# Patient Record
Sex: Female | Born: 1945
Health system: Southern US, Community
[De-identification: ages and names within clinical notes are randomized; demographics above are authoritative.]

## PROBLEM LIST (undated history)

## (undated) DIAGNOSIS — E785 Hyperlipidemia, unspecified: Secondary | ICD-10-CM

## (undated) DIAGNOSIS — B019 Varicella without complication: Secondary | ICD-10-CM

## (undated) DIAGNOSIS — H409 Unspecified glaucoma: Secondary | ICD-10-CM

## (undated) DIAGNOSIS — M810 Age-related osteoporosis without current pathological fracture: Secondary | ICD-10-CM

## (undated) DIAGNOSIS — J45909 Unspecified asthma, uncomplicated: Secondary | ICD-10-CM

## (undated) DIAGNOSIS — H269 Unspecified cataract: Secondary | ICD-10-CM

## (undated) DIAGNOSIS — E079 Disorder of thyroid, unspecified: Secondary | ICD-10-CM

## (undated) DIAGNOSIS — I4891 Unspecified atrial fibrillation: Secondary | ICD-10-CM

## (undated) DIAGNOSIS — K8020 Calculus of gallbladder without cholecystitis without obstruction: Secondary | ICD-10-CM

## (undated) DIAGNOSIS — N390 Urinary tract infection, site not specified: Secondary | ICD-10-CM

## (undated) DIAGNOSIS — I10 Essential (primary) hypertension: Secondary | ICD-10-CM

## (undated) HISTORY — DX: Hyperlipidemia, unspecified: E78.5

## (undated) HISTORY — DX: Calculus of gallbladder without cholecystitis without obstruction: K80.20

## (undated) HISTORY — PX: ABDOMINAL HYSTERECTOMY: SHX81

## (undated) HISTORY — DX: Essential (primary) hypertension: I10

## (undated) HISTORY — PX: ESOPHAGOGASTRODUODENOSCOPY: SHX1529

## (undated) HISTORY — DX: Age-related osteoporosis without current pathological fracture: M81.0

## (undated) HISTORY — PX: BREAST SURGERY: SHX581

## (undated) HISTORY — DX: Varicella without complication: B01.9

## (undated) HISTORY — DX: Urinary tract infection, site not specified: N39.0

## (undated) HISTORY — DX: Disorder of thyroid, unspecified: E07.9

## (undated) HISTORY — DX: Unspecified cataract: H26.9

## (undated) HISTORY — DX: Unspecified atrial fibrillation: I48.91

## (undated) HISTORY — PX: MASTECTOMY: SHX3

## (undated) HISTORY — DX: Unspecified asthma, uncomplicated: J45.909

## (undated) HISTORY — DX: Unspecified glaucoma: H40.9

---

## 1978-08-21 HISTORY — PX: CHOLECYSTECTOMY: SHX55

## 1982-08-21 HISTORY — PX: APPENDECTOMY: SHX54

## 1989-08-21 HISTORY — PX: THYROIDECTOMY: SHX17

## 2009-08-21 HISTORY — PX: COLONOSCOPY: SHX174

## 2013-01-25 DIAGNOSIS — J45909 Unspecified asthma, uncomplicated: Secondary | ICD-10-CM | POA: Insufficient documentation

## 2014-09-24 DIAGNOSIS — I517 Cardiomegaly: Secondary | ICD-10-CM | POA: Insufficient documentation

## 2014-09-24 DIAGNOSIS — R7989 Other specified abnormal findings of blood chemistry: Secondary | ICD-10-CM | POA: Insufficient documentation

## 2015-03-11 DIAGNOSIS — I480 Paroxysmal atrial fibrillation: Secondary | ICD-10-CM | POA: Insufficient documentation

## 2015-03-11 DIAGNOSIS — I5022 Chronic systolic (congestive) heart failure: Secondary | ICD-10-CM | POA: Insufficient documentation

## 2015-03-11 DIAGNOSIS — I10 Essential (primary) hypertension: Secondary | ICD-10-CM | POA: Insufficient documentation

## 2015-08-22 HISTORY — PX: PARTIAL HIP ARTHROPLASTY: SHX733

## 2015-09-10 DIAGNOSIS — N200 Calculus of kidney: Secondary | ICD-10-CM | POA: Insufficient documentation

## 2015-10-29 DIAGNOSIS — S72401A Unspecified fracture of lower end of right femur, initial encounter for closed fracture: Secondary | ICD-10-CM | POA: Insufficient documentation

## 2015-11-24 DIAGNOSIS — M810 Age-related osteoporosis without current pathological fracture: Secondary | ICD-10-CM | POA: Insufficient documentation

## 2015-11-24 DIAGNOSIS — E559 Vitamin D deficiency, unspecified: Secondary | ICD-10-CM | POA: Insufficient documentation

## 2016-01-10 LAB — LIPID PANEL
CHOLESTEROL: 221 — AB (ref 0–200)
HDL: 64 (ref 35–70)
LDL CALC: 138
TRIGLYCERIDES: 95 (ref 40–160)

## 2016-01-10 LAB — BASIC METABOLIC PANEL
BUN: 30 — AB (ref 4–21)
CREATININE: 0.8 (ref 0.5–1.1)
Glucose: 104
Potassium: 3.6 (ref 3.4–5.3)
SODIUM: 143 (ref 137–147)

## 2016-01-10 LAB — HEPATIC FUNCTION PANEL
ALT: 9 (ref 7–35)
AST: 13 (ref 13–35)
Alkaline Phosphatase: 70 (ref 25–125)
BILIRUBIN, TOTAL: 0.5

## 2016-01-10 LAB — VITAMIN D 25 HYDROXY (VIT D DEFICIENCY, FRACTURES): Vit D, 25-Hydroxy: 30.3

## 2016-10-23 DIAGNOSIS — I447 Left bundle-branch block, unspecified: Secondary | ICD-10-CM | POA: Insufficient documentation

## 2017-03-23 LAB — CBC AND DIFFERENTIAL
HCT: 33 — AB (ref 36–46)
Hemoglobin: 10.9 — AB (ref 12.0–16.0)
Platelets: 273 (ref 150–399)

## 2017-03-23 LAB — BASIC METABOLIC PANEL
BUN: 22 — AB (ref 4–21)
Creatinine: 0.9 (ref 0.5–1.1)
Potassium: 4.1 (ref 3.4–5.3)
Sodium: 142 (ref 137–147)

## 2017-03-23 LAB — LIPID PANEL
Cholesterol: 211 — AB (ref 0–200)
HDL: 52 (ref 35–70)
Triglycerides: 92 (ref 40–160)

## 2017-03-28 DIAGNOSIS — D539 Nutritional anemia, unspecified: Secondary | ICD-10-CM | POA: Insufficient documentation

## 2017-03-28 DIAGNOSIS — I952 Hypotension due to drugs: Secondary | ICD-10-CM | POA: Insufficient documentation

## 2017-03-28 DIAGNOSIS — Z1211 Encounter for screening for malignant neoplasm of colon: Secondary | ICD-10-CM | POA: Insufficient documentation

## 2017-10-01 ENCOUNTER — Ambulatory Visit: Payer: Medicare Other | Admitting: Family Medicine

## 2017-10-01 ENCOUNTER — Encounter: Payer: Self-pay | Admitting: Family Medicine

## 2017-10-01 VITALS — BP 128/76 | HR 74 | Ht 65.0 in | Wt 153.4 lb

## 2017-10-01 DIAGNOSIS — M8000XS Age-related osteoporosis with current pathological fracture, unspecified site, sequela: Secondary | ICD-10-CM

## 2017-10-01 DIAGNOSIS — E559 Vitamin D deficiency, unspecified: Secondary | ICD-10-CM

## 2017-10-01 DIAGNOSIS — I10 Essential (primary) hypertension: Secondary | ICD-10-CM | POA: Insufficient documentation

## 2017-10-01 DIAGNOSIS — I480 Paroxysmal atrial fibrillation: Secondary | ICD-10-CM | POA: Diagnosis not present

## 2017-10-01 DIAGNOSIS — I519 Heart disease, unspecified: Secondary | ICD-10-CM | POA: Diagnosis not present

## 2017-10-01 DIAGNOSIS — M8000XA Age-related osteoporosis with current pathological fracture, unspecified site, initial encounter for fracture: Secondary | ICD-10-CM | POA: Insufficient documentation

## 2017-10-01 LAB — URINALYSIS, ROUTINE W REFLEX MICROSCOPIC
Bilirubin Urine: NEGATIVE
HGB URINE DIPSTICK: NEGATIVE
LEUKOCYTES UA: NEGATIVE
Nitrite: NEGATIVE
RBC / HPF: NONE SEEN (ref 0–?)
Specific Gravity, Urine: >=1.03 — AB (ref 1.000–1.030)
Total Protein, Urine: NEGATIVE
URINE GLUCOSE: NEGATIVE
Urobilinogen, UA: 0.2 (ref 0.0–1.0)
pH: 5.5 (ref 5.0–8.0)

## 2017-10-01 LAB — COMPREHENSIVE METABOLIC PANEL
ALBUMIN: 4 g/dL (ref 3.5–5.2)
ALK PHOS: 55 U/L (ref 39–117)
ALT: 10 U/L (ref 0–35)
AST: 12 U/L (ref 0–37)
BILIRUBIN TOTAL: 0.6 mg/dL (ref 0.2–1.2)
BUN: 27 mg/dL — AB (ref 6–23)
CO2: 27 mEq/L (ref 19–32)
CREATININE: 0.78 mg/dL (ref 0.40–1.20)
Calcium: 9.1 mg/dL (ref 8.4–10.5)
Chloride: 105 mEq/L (ref 96–112)
GFR: 77.48 mL/min (ref >60.00)
GLUCOSE: 88 mg/dL (ref 70–99)
POTASSIUM: 4 meq/L (ref 3.5–5.1)
SODIUM: 142 meq/L (ref 135–145)
TOTAL PROTEIN: 6.8 g/dL (ref 6.0–8.3)

## 2017-10-01 LAB — CBC
HCT: 34.6 % — ABNORMAL LOW (ref 35.0–45.0)
Hemoglobin: 11.8 g/dL (ref 11.7–15.5)
MCH: 30.3 pg (ref 27.0–33.0)
MCHC: 34.1 g/dL (ref 32.0–36.0)
MCV: 88.9 fL (ref 80.0–100.0)
MPV: 10.9 fL (ref 7.5–12.5)
PLATELETS: 287 10*3/uL (ref 140–400)
RBC: 3.89 10*6/uL (ref 3.80–5.10)
RDW: 12.8 % (ref 11.0–15.0)
WBC: 6.1 10*3/uL (ref 3.8–10.8)

## 2017-10-01 LAB — VITAMIN D 25 HYDROXY (VIT D DEFICIENCY, FRACTURES): VITD: 88.41 ng/mL (ref 30.00–100.00)

## 2017-10-01 MED ORDER — CARVEDILOL 12.5 MG PO TABS: 12.5000 mg | ORAL_TABLET | Freq: Two times a day (BID) | ORAL | 1 refills | Status: DC

## 2017-10-01 MED ORDER — ALENDRONATE SODIUM 70 MG PO TABS: 70.0000 mg | ORAL_TABLET | ORAL | 0 refills | Status: DC

## 2017-10-01 NOTE — Progress Notes (Signed)
Subjective:  Patient ID: Shelia Roth, female    DOB: 02/26/1946  Age: 72 y.o. MRN: [id number]  CC: Establish Care   HPI Shelia Roth presents for establishment of care and follow-up of her PAF, left ventricular dysfunction, hypertension, osteoporosis and vitamin D deficiency.  She has not had any high-dose vitamin D for over a month now.  She has been doing well.  She continues to work part-time.  She is helping to care for a sick friend in the lung cancer.  She is continuing to raise her great grandson.  Her grandson he has wife and children also live with her.  She was unable to go for the colonoscopy due to above-mentioned social responsibilities.  History Shelia Roth has a past medical history of Asthma, Chicken pox, Hyperlipidemia, and Thyroid disease.   She has a past surgical history that includes Breast surgery; Cholecystectomy; and Abdominal hysterectomy.   Her family history includes COPD in her father; Diabetes in her brother; Early death in her paternal grandfather; Heart attack in her maternal grandfather and paternal grandmother; Heart disease in her daughter and father; Hyperlipidemia in her brother; Hypertension in her father; Stroke in her maternal grandmother.She reports that  has never smoked. she has never used smokeless tobacco. She reports that she does not drink alcohol or use drugs.  Outpatient Medications Prior to Visit  Medication Sig Dispense Refill  . alendronate (FOSAMAX) 70 MG tablet Take 1 tablet by mouth once a week.    Marland Kitchen amLODipine (NORVASC) 5 MG tablet Take 1 tablet by mouth daily.    Marland Kitchen apixaban (ELIQUIS) 5 MG TABS tablet Take 5 mg by mouth daily.    . carvedilol (COREG) 12.5 MG tablet Take 1 tablet by mouth 2 (two) times daily.    Marland Kitchen losartan (COZAAR) 100 MG tablet Take 1 tablet by mouth daily.    . Vitamin D, Ergocalciferol, (DRISDOL) 50000 units CAPS capsule Take 50,000 Units by mouth every 7 (seven) days.     No facility-administered medications prior  to visit.     ROS Review of Systems  Constitutional: Negative.   HENT: Negative.   Eyes: Negative.   Respiratory: Negative.   Cardiovascular: Negative.   Gastrointestinal: Negative.   Endocrine: Negative for polyphagia and polyuria.  Genitourinary: Negative.   Musculoskeletal: Negative.   Skin: Negative.   Allergic/Immunologic: Negative for immunocompromised state.  Neurological: Negative.   Hematological: Negative.   Psychiatric/Behavioral: Negative.     Objective:  BP 128/76 (BP Location: Left Arm, Patient Position: Sitting, Cuff Size: Normal)   Pulse 74   Ht 5\' 5"  (1.651 m)   Wt 153 lb 6 oz (69.6 kg)   LMP  (LMP Unknown)   SpO2 99%   BMI 25.52 kg/m   Physical Exam  Constitutional: She is oriented to person, place, and time. She appears well-developed and well-nourished. No distress.  HENT:  Head: Normocephalic and atraumatic.  Right Ear: External ear normal.  Left Ear: External ear normal.  Mouth/Throat: Oropharynx is clear and moist. No oropharyngeal exudate.  Eyes: Conjunctivae are normal. Pupils are equal, round, and reactive to light. Right eye exhibits no discharge. Left eye exhibits no discharge. No scleral icterus.  Neck: Neck supple. No JVD present. No tracheal deviation present. No thyromegaly present.  Cardiovascular: Normal rate, regular rhythm and normal heart sounds.  Pulmonary/Chest: Effort normal and breath sounds normal. No stridor.  Abdominal: Soft. Bowel sounds are normal. She exhibits no distension. There is no tenderness. There is no rebound and no  guarding.  Lymphadenopathy:    She has no cervical adenopathy.  Neurological: She is alert and oriented to person, place, and time.  Skin: Skin is warm and dry. She is not diaphoretic.  Psychiatric: She has a normal mood and affect. Her behavior is normal.      Assessment & Plan:   Shelia Roth was seen today for establish care.  Diagnoses and all orders for this visit:  Age-related osteoporosis  with current pathological fracture, sequela -     VITAMIN D 25 Hydroxy (Vit-D Deficiency, Fractures)  Vitamin D deficiency -     VITAMIN D 25 Hydroxy (Vit-D Deficiency, Fractures)  PAF (paroxysmal atrial fibrillation) (HCC) -     Comprehensive metabolic panel  Essential hypertension -     CBC -     Comprehensive metabolic panel -     Urinalysis, Routine w reflex microscopic  LV dysfunction -     Comprehensive metabolic panel   I am having Shelia Roth maintain her alendronate, amLODipine, carvedilol, losartan, apixaban, and Vitamin D (Ergocalciferol).  No orders of the defined types were placed in this encounter.    Follow-up: No Follow-up on file.  Libby Maw, MD

## 2017-10-11 ENCOUNTER — Encounter: Payer: Self-pay | Admitting: Family Medicine

## 2018-04-24 ENCOUNTER — Other Ambulatory Visit: Payer: Self-pay | Admitting: Family Medicine

## 2018-04-24 DIAGNOSIS — I519 Heart disease, unspecified: Secondary | ICD-10-CM

## 2018-04-24 DIAGNOSIS — I480 Paroxysmal atrial fibrillation: Secondary | ICD-10-CM

## 2018-04-24 DIAGNOSIS — I10 Essential (primary) hypertension: Secondary | ICD-10-CM

## 2018-05-17 ENCOUNTER — Telehealth: Payer: Self-pay | Admitting: Family Medicine

## 2018-05-17 MED ORDER — AMLODIPINE BESYLATE 5 MG PO TABS: 5.0000 mg | ORAL_TABLET | Freq: Every day | ORAL | 1 refills | Status: DC

## 2018-05-17 NOTE — Telephone Encounter (Signed)
Amlodipine 5 mg  refill Last Refill:07/2817  # ?    Historical provider prescribed medication Last OV: 10/01/17 PCP: [place]  [phone]

## 2018-05-17 NOTE — Telephone Encounter (Signed)
Copied from [place] [id number]. Topic: General - Other >> May 17, 2018  8:26 AM Janace Aris A wrote: Medication: amLODipine (NORVASC) 5 MG tablet   Has the patient contacted their pharmacy? Yes  Preferred Pharmacy (with phone number or street name): [place], [place] - [address]  [phone] (Phone) [phone] (Fax)   Agent: Please be advised that RX refills may take up to 3 business days. We ask that you follow-up with your pharmacy.

## 2018-05-17 NOTE — Telephone Encounter (Signed)
Rx sent 

## 2018-09-03 ENCOUNTER — Ambulatory Visit (INDEPENDENT_AMBULATORY_CARE_PROVIDER_SITE_OTHER): Payer: Medicare Other | Admitting: Family Medicine

## 2018-09-03 ENCOUNTER — Encounter: Payer: Self-pay | Admitting: Family Medicine

## 2018-09-03 VITALS — BP 120/76 | HR 91 | Ht 65.0 in | Wt 157.5 lb

## 2018-09-03 DIAGNOSIS — I10 Essential (primary) hypertension: Secondary | ICD-10-CM

## 2018-09-03 DIAGNOSIS — Z1211 Encounter for screening for malignant neoplasm of colon: Secondary | ICD-10-CM

## 2018-09-03 DIAGNOSIS — M8000XS Age-related osteoporosis with current pathological fracture, unspecified site, sequela: Secondary | ICD-10-CM

## 2018-09-03 DIAGNOSIS — I480 Paroxysmal atrial fibrillation: Secondary | ICD-10-CM

## 2018-09-03 DIAGNOSIS — I519 Heart disease, unspecified: Secondary | ICD-10-CM | POA: Diagnosis not present

## 2018-09-03 DIAGNOSIS — E559 Vitamin D deficiency, unspecified: Secondary | ICD-10-CM

## 2018-09-03 LAB — CBC
HCT: 34.6 % — ABNORMAL LOW (ref 36.0–46.0)
Hemoglobin: 11.8 g/dL — ABNORMAL LOW (ref 12.0–15.0)
MCHC: 34.2 g/dL (ref 30.0–36.0)
MCV: 90.7 fl (ref 78.0–100.0)
Platelets: 263 10*3/uL (ref 150.0–400.0)
RBC: 3.81 Mil/uL — ABNORMAL LOW (ref 3.87–5.11)
RDW: 13.5 % (ref 11.5–15.5)
WBC: 4.1 10*3/uL (ref 4.0–10.5)

## 2018-09-03 LAB — URINALYSIS, ROUTINE W REFLEX MICROSCOPIC
Hgb urine dipstick: NEGATIVE
Leukocytes, UA: NEGATIVE
Nitrite: NEGATIVE
RBC / HPF: NONE SEEN (ref 0–?)
SPECIFIC GRAVITY, URINE: 1.025 (ref 1.000–1.030)
Total Protein, Urine: NEGATIVE
URINE GLUCOSE: NEGATIVE
UROBILINOGEN UA: 0.2 (ref 0.0–1.0)
WBC, UA: NONE SEEN (ref 0–?)
pH: 5.5 (ref 5.0–8.0)

## 2018-09-03 LAB — COMPREHENSIVE METABOLIC PANEL
ALT: 9 U/L (ref 0–35)
AST: 10 U/L (ref 0–37)
Albumin: 3.9 g/dL (ref 3.5–5.2)
Alkaline Phosphatase: 49 U/L (ref 39–117)
BUN: 22 mg/dL (ref 6–23)
CALCIUM: 9 mg/dL (ref 8.4–10.5)
CHLORIDE: 104 meq/L (ref 96–112)
CO2: 27 meq/L (ref 19–32)
CREATININE: 0.79 mg/dL (ref 0.40–1.20)
GFR: 76.15 mL/min (ref >60.00)
GLUCOSE: 93 mg/dL (ref 70–99)
Potassium: 4.2 mEq/L (ref 3.5–5.1)
SODIUM: 139 meq/L (ref 135–145)
Total Bilirubin: 0.6 mg/dL (ref 0.2–1.2)
Total Protein: 6.7 g/dL (ref 6.0–8.3)

## 2018-09-03 LAB — LIPID PANEL
CHOL/HDL RATIO: 4
Cholesterol: 203 mg/dL — ABNORMAL HIGH (ref 0–200)
HDL: 51.5 mg/dL (ref >39.00)
LDL CALC: 130 mg/dL — AB (ref 0–99)
NonHDL: 151.08
TRIGLYCERIDES: 105 mg/dL (ref 0.0–149.0)
VLDL: 21 mg/dL (ref 0.0–40.0)

## 2018-09-03 LAB — VITAMIN D 25 HYDROXY (VIT D DEFICIENCY, FRACTURES): VITD: 61.49 ng/mL (ref 30.00–100.00)

## 2018-09-03 MED ORDER — ALENDRONATE SODIUM 70 MG PO TABS: 70.0000 mg | ORAL_TABLET | ORAL | 0 refills | Status: DC

## 2018-09-03 NOTE — Progress Notes (Signed)
Established Patient Office Visit  Subjective:  Patient ID: Shelia Roth, female    DOB: [birth year]  Age: 73 y.o. MRN: [id number]  CC:  Chief Complaint  Patient presents with  . Annual Exam    HPI Shelia Roth presents for follow-up of her osteoporosis, hypertension vitamin D deficiency and atrial fibrillation.  She continues to take Fosamax weekly.  She is supplementing with calcium and vitamin D as well.  Blood pressures been well controlled with amlodipine carvedilol and losartan.  She cannot remember her last episode of atrial fibrillation continues to take Eliquis without issue prophylactically.  She is working part-time at the occupational health clinic.  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  She is seeing the orthopedist for arthritic issues involving her right knee.  It sounds as though they are going going to try Synvisc injections.  Her last echo showed an ejection fraction of 60%!  She reminds me that her last dye study of her heart showed clean coronaries.  She is seeing the eye doctor regularly.  She agrees to go for a colonoscopy.  She reminds me that with her last colonoscopy it was an entirely bad experience.  She was not fully sedated.  Past Medical History:  Diagnosis Date  . Asthma   . Chicken pox   . Hyperlipidemia   . Thyroid disease     Past Surgical History:  Procedure Laterality Date  . ABDOMINAL HYSTERECTOMY    . BREAST SURGERY    . CHOLECYSTECTOMY      Family History  Problem Relation Age of Onset  . COPD Father   . Heart disease Father   . Hypertension Father   . Diabetes Brother   . Hyperlipidemia Brother   . Heart disease Daughter   . Stroke Maternal Grandmother   . Heart attack Maternal Grandfather   . Heart attack Paternal Grandmother   . Early death Paternal Grandfather     Social History   Socioeconomic History  . Marital status: Divorced    Spouse name: Not on file  . Number of children: Not  on file  . Years of education: Not on file  . Highest education level: Not on file  Occupational History  . Not on file  Social Needs  . Financial resource strain: Not on file  . Food insecurity:    Worry: Not on file    Inability: Not on file  . Transportation needs:    Medical: Not on file    Non-medical: Not on file  Tobacco Use  . Smoking status: Never Smoker  . Smokeless tobacco: Never Used  Substance and Sexual Activity  . Alcohol use: No    Frequency: Never  . Drug use: No  . Sexual activity: Never  Lifestyle  . Physical activity:    Days per week: Not on file    Minutes per session: Not on file  . Stress: Not on file  Relationships  . Social connections:    Talks on phone: Not on file    Gets together: Not on file    Attends religious service: Not on file    Active member of club or organization: Not on file    Attends meetings of clubs or organizations: Not on file    Relationship status: Not on file  . Intimate partner violence:    Fear of current or ex partner: Not on file    Emotionally abused: Not on file  Physically abused: Not on file    Forced sexual activity: Not on file  Other Topics Concern  . Not on file  Social History Narrative  . Not on file    Outpatient Medications Prior to Visit  Medication Sig Dispense Refill  . alendronate (FOSAMAX) 70 MG tablet Take 1 tablet (70 mg total) by mouth once a week. 42 tablet 0  . amLODipine (NORVASC) 5 MG tablet Take 1 tablet (5 mg total) by mouth daily. 90 tablet 1  . apixaban (ELIQUIS) 5 MG TABS tablet Take 5 mg by mouth daily.    . carvedilol (COREG) 12.5 MG tablet TAKE 1 TABLET BY MOUTH TWICE DAILY 180 tablet 1  . losartan (COZAAR) 100 MG tablet Take 1 tablet by mouth daily.    . Vitamin D, Ergocalciferol, (DRISDOL) 50000 units CAPS capsule Take 50,000 Units by mouth every 7 (seven) days.     No facility-administered medications prior to visit.     Allergies  Allergen Reactions  . Codeine  Shortness Of Breath    ROS Review of Systems  Constitutional: Negative.   HENT: Negative.   Eyes: Negative.   Respiratory: Negative.   Cardiovascular: Negative.   Gastrointestinal: Negative.   Endocrine: Negative for polyphagia and polyuria.  Genitourinary: Negative.   Musculoskeletal: Positive for arthralgias.  Skin: Negative for pallor and rash.  Allergic/Immunologic: Negative for immunocompromised state.  Neurological: Negative for light-headedness and headaches.  Hematological: Does not bruise/bleed easily.  Psychiatric/Behavioral: Negative.       Objective:    Physical Exam  Constitutional: She is oriented to person, place, and time. She appears well-developed and well-nourished. No distress.  HENT:  Head: Normocephalic and atraumatic.  Right Ear: External ear normal.  Left Ear: External ear normal.  Mouth/Throat: Oropharynx is clear and moist. No oropharyngeal exudate.  Eyes: Pupils are equal, round, and reactive to light. Conjunctivae are normal. Right eye exhibits no discharge. Left eye exhibits no discharge. No scleral icterus.  Neck: Neck supple. No JVD present. No tracheal deviation present. No thyromegaly present.  Cardiovascular: Normal rate, regular rhythm and normal heart sounds.  Pulmonary/Chest: Effort normal and breath sounds normal. No stridor.  Abdominal: Bowel sounds are normal.  Musculoskeletal:        General: No edema.  Lymphadenopathy:    She has no cervical adenopathy.  Neurological: She is alert and oriented to person, place, and time.  Skin: Skin is warm and dry. She is not diaphoretic.  Multiple waxy stuck on lesions on her back and trunk.  Psychiatric: She has a normal mood and affect. Her behavior is normal.    BP 120/76   Pulse 91   Ht 5\' 5"  (1.651 m)   Wt 157 lb 8 oz (71.4 kg)   LMP  (LMP Unknown)   SpO2 99%   BMI 26.21 kg/m  Wt Readings from Last 3 Encounters:  09/03/18 157 lb 8 oz (71.4 kg)  10/01/17 153 lb 6 oz (69.6 kg)    BP Readings from Last 3 Encounters:  09/03/18 120/76  10/01/17 128/76   Guideline developer:  UpToDate (see UpToDate for funding source) Date Released: June 2014  Health Maintenance Due  Topic Date Due  . Hepatitis C Screening  [birth year]    There are no preventive care reminders to display for this patient.  No results found for: TSH Lab Results  Component Value Date   WBC 6.1 10/01/2017   HGB 11.8 10/01/2017   HCT 34.6 (L) 10/01/2017   MCV 88.9  10/01/2017   PLT 287 10/01/2017   Lab Results  Component Value Date   NA 142 10/01/2017   K 4.0 10/01/2017   CO2 27 10/01/2017   GLUCOSE 88 10/01/2017   BUN 27 (H) 10/01/2017   CREATININE 0.78 10/01/2017   BILITOT 0.6 10/01/2017   ALKPHOS 55 10/01/2017   AST 12 10/01/2017   ALT 10 10/01/2017   PROT 6.8 10/01/2017   ALBUMIN 4.0 10/01/2017   CALCIUM 9.1 10/01/2017   GFR 77.48 10/01/2017   Lab Results  Component Value Date   CHOL 221 (A) 01/10/2016   Lab Results  Component Value Date   HDL 64 01/10/2016   Lab Results  Component Value Date   LDLCALC 138 01/10/2016   Lab Results  Component Value Date   TRIG 95 01/10/2016   No results found for: CHOLHDL No results found for: HGBA1C    Assessment & Plan:   Problem List Items Addressed This Visit      Cardiovascular and Mediastinum   PAF (paroxysmal atrial fibrillation) ([place])   Relevant Orders   Lipid panel   Essential hypertension   Relevant Orders   CBC   Comprehensive metabolic panel   Urinalysis, Routine w reflex microscopic   LV dysfunction   Relevant Orders   Lipid panel     Musculoskeletal and Integument   Age-related osteoporosis with current pathological fracture - Primary     Other   Vitamin D deficiency   Relevant Orders   VITAMIN D 25 Hydroxy (Vit-D Deficiency, Fractures)   Screen for colon cancer   Relevant Orders   Ambulatory referral to Gastroenterology      No orders of the defined types were placed in this  encounter.   Follow-up: No follow-ups on file.

## 2018-09-19 ENCOUNTER — Encounter: Payer: Self-pay | Admitting: Gastroenterology

## 2018-10-08 ENCOUNTER — Ambulatory Visit: Payer: Medicare Other | Admitting: Gastroenterology

## 2018-10-08 ENCOUNTER — Encounter: Payer: Self-pay | Admitting: Gastroenterology

## 2018-10-08 VITALS — BP 126/72 | HR 68 | Ht 65.0 in | Wt 160.1 lb

## 2018-10-08 DIAGNOSIS — Z7901 Long term (current) use of anticoagulants: Secondary | ICD-10-CM | POA: Diagnosis not present

## 2018-10-08 DIAGNOSIS — Z1211 Encounter for screening for malignant neoplasm of colon: Secondary | ICD-10-CM | POA: Diagnosis not present

## 2018-10-08 NOTE — Progress Notes (Signed)
Chief Complaint: CRC screening  Referring Provider:     Libby Maw, MD   HPI:    Shelia Roth is a 73 y.o. female with a history of osteoporosis, hypertension, atrial fibrillation (on Eliquis), vitamin D deficiency referred to the Gastroenterology Clinic for evaluation of ongoing CRC screening.  Last colonoscopy was in 2011 which was reportedly normal with recommendation repeat in 5 years. She is otherwise without any GI symptoms.  She otherwise denies any known family history of CRC, adenomas, or GI malignancies. Patient otherwise denies nausea, vomiting, diarrhea, constipation, hematochezia, melena, night sweats, fever, chills, weight loss, early satiety, dysphagia, odynophagia.  Endoscopic history: -Colonoscopy (01/2010, Dr. Cindee Lame at [facility]): Official report unavailable, but limited note suggests no abnormality noted with recommendation repeat in 5 years.   Past Medical History:  Diagnosis Date  . Asthma   . Atrial fibrillation ([place])   . Chicken pox   . Gallstones   . Hyperlipidemia   . Hypertension   . Thyroid disease   . UTI (urinary tract infection)      Past Surgical History:  Procedure Laterality Date  . ABDOMINAL HYSTERECTOMY    . APPENDECTOMY  1984  . BREAST SURGERY    . CHOLECYSTECTOMY  1980  . COLONOSCOPY  2011   [facility]  . ESOPHAGOGASTRODUODENOSCOPY     right after gallbladder removal   . MASTECTOMY Bilateral    for mastitis. Says to do blood pressure on left   . PARTIAL HIP ARTHROPLASTY  2017   replacement of head per patient   . THYROIDECTOMY  1991   Family History  Problem Relation Age of Onset  . COPD Father   . Heart disease Father   . Hypertension Father   . Stomach cancer Father        said that they autospy didn't state but dr thinks he did have this. Died with internal bleeding and cardiac arrest   . Diabetes Brother   . Hyperlipidemia Brother   . Heart disease Daughter   . Stroke Maternal  Grandmother   . Heart attack Maternal Grandfather   . Heart attack Paternal Grandmother   . Early death Paternal Grandfather   . Colon cancer Neg Hx   . Esophageal cancer Neg Hx    Social History   Tobacco Use  . Smoking status: Never Smoker  . Smokeless tobacco: Never Used  Substance Use Topics  . Alcohol use: No    Frequency: Never  . Drug use: Never   Current Outpatient Medications  Medication Sig Dispense Refill  . alendronate (FOSAMAX) 70 MG tablet Take 1 tablet (70 mg total) by mouth once a week. 42 tablet 0  . amLODipine (NORVASC) 5 MG tablet Take 1 tablet (5 mg total) by mouth daily. 90 tablet 1  . apixaban (ELIQUIS) 5 MG TABS tablet Take 5 mg by mouth daily.    . Calcium Carb-Cholecalciferol (CALCIUM + VITAMIN D3 PO) Take 1 tablet by mouth 2 (two) times daily. Calcium 600mg  and Vitamin d3 83mcg    . carvedilol (COREG) 12.5 MG tablet TAKE 1 TABLET BY MOUTH TWICE DAILY 180 tablet 1  . losartan (COZAAR) 100 MG tablet Take 1 tablet by mouth daily.     No current facility-administered medications for this visit.    Allergies  Allergen Reactions  . Codeine Shortness Of Breath     Review of Systems: All systems reviewed and negative  except where noted in HPI.     Physical Exam:    Wt Readings from Last 3 Encounters:  10/08/18 160 lb 2 oz (72.6 kg)  09/03/18 157 lb 8 oz (71.4 kg)  10/01/17 153 lb 6 oz (69.6 kg)    BP 126/72   Pulse 68   Ht 5\' 5"  (1.651 m)   Wt 160 lb 2 oz (72.6 kg)   LMP  (LMP Unknown)   BMI 26.65 kg/m  Constitutional:  Pleasant, in no acute distress. Psychiatric: Normal mood and affect. Behavior is normal. EENT: Pupils normal.  Conjunctivae are normal. No scleral icterus. Neck supple. No cervical LAD. Cardiovascular: Normal rate, regular rhythm. No edema Pulmonary/chest: Effort normal and breath sounds normal. No wheezing, rales or rhonchi. Abdominal: Soft, nondistended, nontender. Bowel sounds active throughout. There are no masses  palpable. No hepatomegaly. Neurological: Alert and oriented to person place and time. Skin: Skin is warm and dry. No rashes noted.   ASSESSMENT AND PLAN;   Shelia Roth is a 73 y.o. female presenting with:  1) CRC screening: Plan for repeat colonoscopy for ongoing CRC screening.  Last colonoscopy was 2011 and was normal per patient and limited documentation available, with recommendation to repeat in 5 years.  Discussed current guideline recommendations with the patient along with possibility for previous guideline recommendations of 5-year interval.  Given that it is been 9 years, previous recommendation for 5-year interval, and patient concerns, it is reasonable to repeat colonoscopy at this time for ongoing screening.  If repeat colonoscopy is again normal, can likely stop screening protocol in this 73 year old patient in the absence of GI symptoms in the future.  - Colonoscopy for ongoing CRC screening  2) Chronic anticoagulation: Hold Eliquis 2 days before procedure - will instruct when and how to resume after procedure. Low but real risk of cardiovascular event such as heart attack, stroke, embolism, thrombosis or ischemia/infarct of other organs off Eliquis explained and need to seek urgent help if this occurs. The patient consents to proceed. Will communicate by phone or EMR with patient's prescribing provider to confirm that holding Eliquis is reasonable in this case.  The indications, risks, and benefits of colonoscopy were explained to the patient in detail. Risks include but are not limited to bleeding, perforation, adverse reaction to medications, and cardiopulmonary compromise. Sequelae include but are not limited to the possibility of surgery, hospitalization, and mortality. The patient verbalized understanding and wished to proceed. All questions answered, referred to the scheduler and bowel prep ordered. Further recommendations pending results of the exam.    Shelia Bullion,  DO, FACG  10/08/2018, 10:23 AM   Libby Maw,*

## 2018-10-08 NOTE — Patient Instructions (Addendum)
If you are age 72 or older, your body mass index should be between 23-30. Your Body mass index is 26.65 kg/m. If this is out of the aforementioned range listed, please consider follow up with your Primary Care Provider.  If you are age 31 or younger, your body mass index should be between 19-25. Your Body mass index is 26.65 kg/m. If this is out of the aformentioned range listed, please consider follow up with your Primary Care Provider.   It has been recommended to you by your physician that you have a(n) Colonoscopy completed. Per your request, we did not schedule the procedure(s) today. Please contact our office at [phone] should you decide to have the procedure completed.   You will be contacted by our office prior to your procedure for directions on holding your Eliquis.  If you do not hear from our office 1 week prior to your scheduled procedure, please call [phone] to discuss.    It was a pleasure to see you today!  Vito Cirigliano, D.O.

## 2018-11-06 ENCOUNTER — Other Ambulatory Visit: Payer: Self-pay | Admitting: Family Medicine

## 2018-11-06 DIAGNOSIS — I480 Paroxysmal atrial fibrillation: Secondary | ICD-10-CM

## 2018-11-06 DIAGNOSIS — I519 Heart disease, unspecified: Secondary | ICD-10-CM

## 2018-11-06 DIAGNOSIS — I10 Essential (primary) hypertension: Secondary | ICD-10-CM

## 2019-02-03 ENCOUNTER — Other Ambulatory Visit: Payer: Self-pay | Admitting: Family Medicine

## 2019-02-03 DIAGNOSIS — I480 Paroxysmal atrial fibrillation: Secondary | ICD-10-CM

## 2019-02-03 DIAGNOSIS — I10 Essential (primary) hypertension: Secondary | ICD-10-CM

## 2019-02-03 DIAGNOSIS — I519 Heart disease, unspecified: Secondary | ICD-10-CM

## 2019-05-07 ENCOUNTER — Other Ambulatory Visit: Payer: Self-pay | Admitting: Family Medicine

## 2019-05-07 DIAGNOSIS — I10 Essential (primary) hypertension: Secondary | ICD-10-CM

## 2019-05-07 DIAGNOSIS — I519 Heart disease, unspecified: Secondary | ICD-10-CM

## 2019-05-07 DIAGNOSIS — I480 Paroxysmal atrial fibrillation: Secondary | ICD-10-CM

## 2019-05-12 ENCOUNTER — Other Ambulatory Visit: Payer: Self-pay | Admitting: Family Medicine

## 2019-05-13 NOTE — Progress Notes (Addendum)
Virtual Visit via Video Note  I connected with patient on 05/15/19 at 11:00 AM EDT by audio enabled telemedicine application and verified that I am speaking with the correct person using two identifiers.   THIS ENCOUNTER IS A VIRTUAL VISIT DUE TO COVID-19 - PATIENT WAS NOT SEEN IN THE OFFICE. PATIENT HAS CONSENTED TO VIRTUAL VISIT / TELEMEDICINE VISIT   Location of patient: home  Location of provider: office  I discussed the limitations of evaluation and management by telemedicine and the availability of in person appointments. The patient expressed understanding and agreed to proceed.   Subjective:   Shelia Roth is a 73 y.o. female who presents for an Initial Medicare Annual Wellness Visit.  Review of Systems    Cardiac Risk Factors include: advanced age (>107men, >28 women);hypertension Home Safety/Smoke Alarms: Feels safe in home. Smoke alarms in place.  Live in 1 story home. Has custody of great grandson (29yrs old). Yolanda Bonine and his family ( wife and 2 kids) live her. 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   Female:   Mammo- hx mastectomy bilateral      Dexa scan- care everywhere 11/23/15. Ordered.      CCS- declines at this time     Objective:    Unable to assess. This visit is enabled though telemedicine due to Covid 19.   Advanced Directives 05/14/2019  Does Patient Have a Medical Advance Directive? No  Would patient like information on creating a medical advance directive? No - Patient declined    Current Medications (verified) Outpatient Encounter Medications as of 05/14/2019  Medication Sig  . alendronate (FOSAMAX) 70 MG tablet Take 1 tablet (70 mg total) by mouth once a week.  Marland Kitchen amLODipine (NORVASC) 5 MG tablet Take 1 tablet by mouth once daily  . Calcium Carb-Cholecalciferol (CALCIUM + VITAMIN D3 PO) Take 1 tablet by mouth 2 (two) times daily. Calcium 600mg  and Vitamin d3 31mcg  . carvedilol (COREG) 12.5 MG tablet Take 1 tablet by mouth  twice daily  . losartan (COZAAR) 100 MG tablet Take 1 tablet by mouth daily.  . [DISCONTINUED] apixaban (ELIQUIS) 5 MG TABS tablet Take 5 mg by mouth daily.   No facility-administered encounter medications on file as of 05/14/2019.     Allergies (verified) Codeine   History: Past Medical History:  Diagnosis Date  . Asthma   . Atrial fibrillation ([place])   . Chicken pox   . Gallstones   . Hyperlipidemia   . Hypertension   . Thyroid disease   . UTI (urinary tract infection)    Past Surgical History:  Procedure Laterality Date  . ABDOMINAL HYSTERECTOMY    . APPENDECTOMY  1984  . BREAST SURGERY    . CHOLECYSTECTOMY  1980  . COLONOSCOPY  2011   [facility]  . ESOPHAGOGASTRODUODENOSCOPY     right after gallbladder removal   . MASTECTOMY Bilateral    for mastitis. Says to do blood pressure on left   . PARTIAL HIP ARTHROPLASTY  2017   replacement of head per patient   . THYROIDECTOMY  1991   Family History  Problem Relation Age of Onset  . COPD Father   . Heart disease Father   . Hypertension Father   . Stomach cancer Father        said that they autospy didn't state but dr thinks he did have this. Died with internal bleeding and cardiac arrest   . Diabetes Brother   . Hyperlipidemia Brother   .  Heart disease Daughter   . Stroke Maternal Grandmother   . Heart attack Maternal Grandfather   . Heart attack Paternal Grandmother   . Early death Paternal Grandfather   . Colon cancer Neg Hx   . Esophageal cancer Neg Hx    Social History   Socioeconomic History  . Marital status: Divorced    Spouse name: Not on file  . Number of children: 1  . Years of education: Not on file  . Highest education level: Not on file  Occupational History  . Not on file  Social Needs  . Financial resource strain: Not on file  . Food insecurity    Worry: Not on file    Inability: Not on file  . Transportation needs    Medical: Not on file    Non-medical: Not on file  Tobacco Use   . Smoking status: Never Smoker  . Smokeless tobacco: Never Used  Substance and Sexual Activity  . Alcohol use: No    Frequency: Never  . Drug use: Never  . Sexual activity: Never  Lifestyle  . Physical activity    Days per week: Not on file    Minutes per session: Not on file  . Stress: Not on file  Relationships  . Social Herbalist on phone: Not on file    Gets together: Not on file    Attends religious service: Not on file    Active member of club or organization: Not on file    Attends meetings of clubs or organizations: Not on file    Relationship status: Not on file  Other Topics Concern  . Not on file  Social History Narrative  . Not on file    Tobacco Counseling Counseling given: Not Answered   Clinical Intake: Pain : No/denies pain   Activities of Daily Living In your present state of health, do you have any difficulty performing the following activities: 05/14/2019  Hearing? N  Vision? N  Difficulty concentrating or making decisions? N  Walking or climbing stairs? N  Dressing or bathing? N  Doing errands, shopping? N  Preparing Food and eating ? N  Using the Toilet? N  In the past six months, have you accidently leaked urine? Y  Do you have problems with loss of bowel control? N  Managing your Medications? N  Managing your Finances? N  Housekeeping or managing your Housekeeping? N  Some recent data might be hidden     Immunizations and Health Maintenance Immunization History  Administered Date(s) Administered  . Influenza-Unspecified 05/21/2017, 08/21/2018  . Pneumococcal Conjugate-13 12/13/2014  . Pneumococcal Polysaccharide-23 10/29/2012   Health Maintenance Due  Topic Date Due  . Hepatitis C Screening  Jun 24, 1946  . INFLUENZA VACCINE  03/22/2019    Patient Care Team: Libby Maw, MD as PCP - General (Family Medicine)  Indicate any recent Medical Services you may have received from other than [facility] providers in the  past year (date may be approximate).     Assessment:   This is a routine wellness examination for Shelia Roth. Physical assessment deferred to PCP.  Hearing/Vision screen Unable to assess. This visit is enabled though telemedicine due to Covid 19.   Dietary issues and exercise activities discussed: Current Exercise Habits: Home exercise routine, Type of exercise: walking, Time (Minutes): 45, Frequency (Times/Week): 5, Weekly Exercise (Minutes/Week): 225, Intensity: Mild, Exercise limited by: None identified   Diet (meal preparation, eat out, water intake, caffeinated beverages, dairy products, fruits and  vegetables): 24 hr recall Breakfast: cereal or toast and egg Lunch: yogurt and fruit Dinner:   Meat, starch, vegetable. Pt reports she drinks water daily.  Goals    . Patient Stated     Maintain healthy lifestyle       Depression Screen PHQ 2/9 Scores 05/14/2019  PHQ - 2 Score 0    Fall Risk Fall Risk  05/14/2019  Falls in the past year? 0    Cognitive Function: Ad8 score reviewed for issues:  Issues making decisions:no  Less interest in hobbies / activities:no  Repeats questions, stories (family complaining):no  Trouble using ordinary gadgets (microwave, computer, phone):no  Forgets the month or year: no  Mismanaging finances: no  Remembering appts:no  Daily problems with thinking and/or memory:no Ad8 score is=0         Screening Tests Health Maintenance  Topic Date Due  . Hepatitis C Screening  [birth year]  . INFLUENZA VACCINE  03/22/2019  . TETANUS/TDAP  06/18/2020 (Originally 03/14/1966)  . COLONOSCOPY  02/17/2020  . DEXA SCAN  Completed  . PNA vac Low Risk Adult  Completed  . MAMMOGRAM  Discontinued     Plan:   See you next year!  Continue to eat heart healthy diet (full of fruits, vegetables, whole grains, lean protein, water--limit salt, fat, and sugar intake) and increase physical activity as tolerated.  Continue doing brain stimulating  activities (puzzles, reading, adult coloring books, staying active) to keep memory sharp.   I have ordered your dexa scan. Please schedule.   I have personally reviewed and noted the following in the patient's chart:   . Medical and social history . Use of alcohol, tobacco or illicit drugs  . Current medications and supplements . Functional ability and status . Nutritional status . Physical activity . Advanced directives . List of other physicians . Hospitalizations, surgeries, and ER visits in previous 12 months . Vitals . Screenings to include cognitive, depression, and falls . Referrals and appointments  In addition, I have reviewed and discussed with patient certain preventive protocols, quality metrics, and best practice recommendations. A written personalized care plan for preventive services as well as general preventive health recommendations were provided to patient.     Shela Nevin, [place]   05/14/2019     Reviewed and agree

## 2019-05-14 ENCOUNTER — Ambulatory Visit (INDEPENDENT_AMBULATORY_CARE_PROVIDER_SITE_OTHER): Payer: Medicare Other | Admitting: *Deleted

## 2019-05-14 ENCOUNTER — Encounter: Payer: Self-pay | Admitting: *Deleted

## 2019-05-14 DIAGNOSIS — Z0000 Encounter for general adult medical examination without abnormal findings: Secondary | ICD-10-CM | POA: Diagnosis not present

## 2019-05-14 DIAGNOSIS — Z780 Asymptomatic menopausal state: Secondary | ICD-10-CM

## 2019-05-14 NOTE — Patient Instructions (Signed)
See you next year!  Continue to eat heart healthy diet (full of fruits, vegetables, whole grains, lean protein, water--limit salt, fat, and sugar intake) and increase physical activity as tolerated.  Continue doing brain stimulating activities (puzzles, reading, adult coloring books, staying active) to keep memory sharp.   I have ordered your dexa scan. Please schedule.    Ms. Shelia Roth , Thank you for taking time to come for your Medicare Wellness Visit. I appreciate your ongoing commitment to your health goals. Please review the following plan we discussed and let me know if I can assist you in the future.   These are the goals we discussed: Goals    . Patient Stated     Maintain healthy lifestyle        This is a list of the screening recommended for you and due dates:  Health Maintenance  Topic Date Due  .  Hepatitis C: One time screening is recommended by Center for Disease Control  (CDC) for  adults born from [birth year] through 1964.   1946-03-05  . Flu Shot  03/22/2019  . Tetanus Vaccine  06/18/2020*  . Colon Cancer Screening  02/17/2020  . DEXA scan (bone density measurement)  Completed  . Pneumonia vaccines  Completed  . Mammogram  Discontinued  *Topic was postponed. The date shown is not the original due date.    Health Maintenance After Age 47 After age 52, you are at a higher risk for certain long-term diseases and infections as well as injuries from falls. Falls are a major cause of broken bones and head injuries in people who are older than age 72. Getting regular preventive care can help to keep you healthy and well. Preventive care includes getting regular testing and making lifestyle changes as recommended by your health care provider. Talk with your health care provider about:  Which screenings and tests you should have. A screening is a test that checks for a disease when you have no symptoms.  A diet and exercise plan that is right for you. What should I know about  screenings and tests to prevent falls? Screening and testing are the best ways to find a health problem early. Early diagnosis and treatment give you the best chance of managing medical conditions that are common after age 45. Certain conditions and lifestyle choices may make you more likely to have a fall. Your health care provider may recommend:  Regular vision checks. Poor vision and conditions such as cataracts can make you more likely to have a fall. If you wear glasses, make sure to get your prescription updated if your vision changes.  Medicine review. Work with your health care provider to regularly review all of the medicines you are taking, including over-the-counter medicines. Ask your health care provider about any side effects that may make you more likely to have a fall. Tell your health care provider if any medicines that you take make you feel dizzy or sleepy.  Osteoporosis screening. Osteoporosis is a condition that causes the bones to get weaker. This can make the bones weak and cause them to break more easily.  Blood pressure screening. Blood pressure changes and medicines to control blood pressure can make you feel dizzy.  Strength and balance checks. Your health care provider may recommend certain tests to check your strength and balance while standing, walking, or changing positions.  Foot health exam. Foot pain and numbness, as well as not wearing proper footwear, can make you more likely to have  a fall.  Depression screening. You may be more likely to have a fall if you have a fear of falling, feel emotionally low, or feel unable to do activities that you used to do.  Alcohol use screening. Using too much alcohol can affect your balance and may make you more likely to have a fall. What actions can I take to lower my risk of falls? General instructions  Talk with your health care provider about your risks for falling. Tell your health care provider if: ? You fall. Be sure  to tell your health care provider about all falls, even ones that seem minor. ? You feel dizzy, sleepy, or off-balance.  Take over-the-counter and prescription medicines only as told by your health care provider. These include any supplements.  Eat a healthy diet and maintain a healthy weight. A healthy diet includes low-fat dairy products, low-fat (lean) meats, and fiber from whole grains, beans, and lots of fruits and vegetables. Home safety  Remove any tripping hazards, such as rugs, cords, and clutter.  Install safety equipment such as grab bars in bathrooms and safety rails on stairs.  Keep rooms and walkways well-lit. Activity   Follow a regular exercise program to stay fit. This will help you maintain your balance. Ask your health care provider what types of exercise are appropriate for you.  If you need a cane or walker, use it as recommended by your health care provider.  Wear supportive shoes that have nonskid soles. Lifestyle  Do not drink alcohol if your health care provider tells you not to drink.  If you drink alcohol, limit how much you have: ? 0-1 drink a day for women. ? 0-2 drinks a day for men.  Be aware of how much alcohol is in your drink. In the [place] one drink equals one typical bottle of beer (12 oz), one-half glass of wine (5 oz), or one shot of hard liquor (1 oz).  Do not use any products that contain nicotine or tobacco, such as cigarettes and e-cigarettes. If you need help quitting, ask your health care provider. Summary  Having a healthy lifestyle and getting preventive care can help to protect your health and wellness after age 60.  Screening and testing are the best way to find a health problem early and help you avoid having a fall. Early diagnosis and treatment give you the best chance for managing medical conditions that are more common for people who are older than age 42.  Falls are a major cause of broken bones and head injuries in people  who are older than age 45. Take precautions to prevent a fall at home.  Work with your health care provider to learn what changes you can make to improve your health and wellness and to prevent falls. This information is not intended to replace advice given to you by your health care provider. Make sure you discuss any questions you have with your health care provider. Document Released: 06/20/2017 Document Revised: 11/28/2018 Document Reviewed: 06/20/2017 Elsevier Patient Education  2020 [employer].

## 2019-06-10 ENCOUNTER — Ambulatory Visit (HOSPITAL_BASED_OUTPATIENT_CLINIC_OR_DEPARTMENT_OTHER)
Admission: RE | Admit: 2019-06-10 | Discharge: 2019-06-10 | Disposition: A | Discharge status: Home / Self Care | Payer: Medicare Other | Source: Ambulatory Visit | Attending: Family Medicine | Admitting: Family Medicine

## 2019-06-10 ENCOUNTER — Other Ambulatory Visit: Payer: Self-pay

## 2019-06-10 DIAGNOSIS — M810 Age-related osteoporosis without current pathological fracture: Secondary | ICD-10-CM | POA: Diagnosis not present

## 2019-06-10 DIAGNOSIS — Z780 Asymptomatic menopausal state: Secondary | ICD-10-CM

## 2019-06-10 DIAGNOSIS — M8589 Other specified disorders of bone density and structure, multiple sites: Secondary | ICD-10-CM | POA: Diagnosis not present

## 2019-08-06 ENCOUNTER — Other Ambulatory Visit: Payer: Self-pay | Admitting: Family Medicine

## 2019-08-06 DIAGNOSIS — I480 Paroxysmal atrial fibrillation: Secondary | ICD-10-CM

## 2019-08-06 DIAGNOSIS — I519 Heart disease, unspecified: Secondary | ICD-10-CM

## 2019-08-06 DIAGNOSIS — I10 Essential (primary) hypertension: Secondary | ICD-10-CM

## 2019-10-08 ENCOUNTER — Telehealth: Payer: Self-pay | Admitting: Family Medicine

## 2019-10-08 NOTE — Telephone Encounter (Signed)
Patient is calling and requesting a refill for Carvedilol sent to [facility] in [place]. CB is [phone]

## 2019-10-08 NOTE — Telephone Encounter (Signed)
Patient need follow up on medication appt scheduled.

## 2019-10-15 ENCOUNTER — Encounter: Payer: Self-pay | Admitting: Family Medicine

## 2019-10-15 ENCOUNTER — Ambulatory Visit (INDEPENDENT_AMBULATORY_CARE_PROVIDER_SITE_OTHER): Payer: Medicare Other | Admitting: Family Medicine

## 2019-10-15 VITALS — Temp 97.6°F | Ht 65.0 in | Wt 160.0 lb

## 2019-10-15 DIAGNOSIS — E559 Vitamin D deficiency, unspecified: Secondary | ICD-10-CM

## 2019-10-15 DIAGNOSIS — M8000XS Age-related osteoporosis with current pathological fracture, unspecified site, sequela: Secondary | ICD-10-CM

## 2019-10-15 DIAGNOSIS — I10 Essential (primary) hypertension: Secondary | ICD-10-CM

## 2019-10-15 DIAGNOSIS — R0982 Postnasal drip: Secondary | ICD-10-CM | POA: Diagnosis not present

## 2019-10-15 MED ORDER — MOMETASONE FUROATE 50 MCG/ACT NA SUSP: 2.0000 | Freq: Every day | NASAL | 12 refills | Status: DC

## 2019-10-15 NOTE — Progress Notes (Signed)
Established Patient Office Visit  Subjective:  Patient ID: Shelia Roth, female    DOB: 22-Feb-1946  Age: 74 y.o. MRN: [id number]  CC:  Chief Complaint  Patient presents with  . Follow-up    Refill/follow up on medications, no concerns.     HPI Shelia Roth presents for   follow-up of her hypertension porosis and postnasal drip.  Blood pressure has been well controlled with her current regimen typically running in the 120s over mid 60s range.  She denies lightheadedness or dizziness at this time.  Continues with the Fosamax weekly and calcium with vitamin D supplementation.  Knee is doing much better status post Synvisc injections.  She is having ongoing issue with postnasal drip sneezing and mild cough.  Denies fever chills difficulty breathing diarrhea or rash.  No changes in taste or smell.  Is on the list at [facility] to have her Covid vaccine  Past Medical History:  Diagnosis Date  . Asthma   . Atrial fibrillation ([place])   . Chicken pox   . Gallstones   . Hyperlipidemia   . Hypertension   . Thyroid disease   . UTI (urinary tract infection)     Past Surgical History:  Procedure Laterality Date  . ABDOMINAL HYSTERECTOMY    . APPENDECTOMY  1984  . BREAST SURGERY    . CHOLECYSTECTOMY  1980  . COLONOSCOPY  2011   [facility]  . ESOPHAGOGASTRODUODENOSCOPY     right after gallbladder removal   . MASTECTOMY Bilateral    for mastitis. Says to do blood pressure on left   . PARTIAL HIP ARTHROPLASTY  2017   replacement of head per patient   . THYROIDECTOMY  1991    Family History  Problem Relation Age of Onset  . COPD Father   . Heart disease Father   . Hypertension Father   . Stomach cancer Father        said that they autospy didn't state but dr thinks he did have this. Died with internal bleeding and cardiac arrest   . Diabetes Brother   . Hyperlipidemia Brother   . Heart disease Daughter   . Stroke Maternal Grandmother   . Heart attack Maternal Grandfather   .  Heart attack Paternal Grandmother   . Early death Paternal Grandfather   . Colon cancer Neg Hx   . Esophageal cancer Neg Hx     Social History   Socioeconomic History  . Marital status: Divorced    Spouse name: Not on file  . Number of children: 1  . Years of education: Not on file  . Highest education level: Not on file  Occupational History  . Not on file  Tobacco Use  . Smoking status: Never Smoker  . Smokeless tobacco: Never Used  Substance and Sexual Activity  . Alcohol use: No  . Drug use: Never  . Sexual activity: Never  Other Topics Concern  . Not on file  Social History Narrative  . Not on file   Social Determinants of Health   Financial Resource Strain:   . Difficulty of Paying Living Expenses: Not on file  Food Insecurity:   . Worried About Charity fundraiser in the Last Year: Not on file  . Ran Out of Food in the Last Year: Not on file  Transportation Needs:   . Lack of Transportation (Medical): Not on file  . Lack of Transportation (Non-Medical): Not on file  Physical Activity:   . Days of  Exercise per Week: Not on file  . Minutes of Exercise per Session: Not on file  Stress:   . Feeling of Stress : Not on file  Social Connections:   . Frequency of Communication with Friends and Family: Not on file  . Frequency of Social Gatherings with Friends and Family: Not on file  . Attends Religious Services: Not on file  . Active Member of Clubs or Organizations: Not on file  . Attends Archivist Meetings: Not on file  . Marital Status: Not on file  Intimate Partner Violence:   . Fear of Current or Ex-Partner: Not on file  . Emotionally Abused: Not on file  . Physically Abused: Not on file  . Sexually Abused: Not on file    Outpatient Medications Prior to Visit  Medication Sig Dispense Refill  . alendronate (FOSAMAX) 70 MG tablet Take 1 tablet (70 mg total) by mouth once a week. 42 tablet 0  . amLODipine (NORVASC) 5 MG tablet Take 1qd (Plz  sched phone visit with PCP for future fills) 90 tablet 0  . Calcium Carb-Cholecalciferol (CALCIUM + VITAMIN D3 PO) Take 1 tablet by mouth 2 (two) times daily. Calcium 600mg  and Vitamin d3 77mcg    . carvedilol (COREG) 12.5 MG tablet Take 1bid (Plz sched phone visit with PCP for future fills) 180 tablet 0  . losartan (COZAAR) 100 MG tablet Take 1 tablet by mouth daily.     No facility-administered medications prior to visit.    Allergies  Allergen Reactions  . Codeine Shortness Of Breath    ROS Review of Systems  Constitutional: Negative for chills, diaphoresis, fatigue, fever and unexpected weight change.  HENT: Positive for postnasal drip and rhinorrhea.   Eyes: Negative for photophobia and visual disturbance.  Respiratory: Negative for cough, chest tightness and shortness of breath.   Cardiovascular: Negative for chest pain and palpitations.  Gastrointestinal: Negative.   Genitourinary: Negative.   Psychiatric/Behavioral: Negative.       Objective:    Physical Exam  Constitutional: She is oriented to person, place, and time. No distress.  Pulmonary/Chest: Effort normal.  Neurological: She is alert and oriented to person, place, and time.  Psychiatric: She has a normal mood and affect.    Temp 97.6 F (36.4 C) (Oral) Comment: per pt  Ht 5\' 5"  (1.651 m)   Wt 160 lb (72.6 kg) Comment: per patient  LMP  (LMP Unknown)   BMI 26.63 kg/m  Wt Readings from Last 3 Encounters:  10/15/19 160 lb (72.6 kg)  10/08/18 160 lb 2 oz (72.6 kg)  09/03/18 157 lb 8 oz (71.4 kg)     Health Maintenance Due  Topic Date Due  . Hepatitis C Screening  June 30, 1946    There are no preventive care reminders to display for this patient.  No results found for: TSH Lab Results  Component Value Date   WBC 4.1 09/03/2018   HGB 11.8 (L) 09/03/2018   HCT 34.6 (L) 09/03/2018   MCV 90.7 09/03/2018   PLT 263.0 09/03/2018   Lab Results  Component Value Date   NA 139 09/03/2018   K 4.2  09/03/2018   CO2 27 09/03/2018   GLUCOSE 93 09/03/2018   BUN 22 09/03/2018   CREATININE 0.79 09/03/2018   BILITOT 0.6 09/03/2018   ALKPHOS 49 09/03/2018   AST 10 09/03/2018   ALT 9 09/03/2018   PROT 6.7 09/03/2018   ALBUMIN 3.9 09/03/2018   CALCIUM 9.0 09/03/2018   GFR 76.15  09/03/2018   Lab Results  Component Value Date   CHOL 203 (H) 09/03/2018   Lab Results  Component Value Date   HDL 51.50 09/03/2018   Lab Results  Component Value Date   LDLCALC 130 (H) 09/03/2018   Lab Results  Component Value Date   TRIG 105.0 09/03/2018   Lab Results  Component Value Date   CHOLHDL 4 09/03/2018   No results found for: HGBA1C    Assessment & Plan:   Problem List Items Addressed This Visit      Cardiovascular and Mediastinum   Essential hypertension     Musculoskeletal and Integument   Age-related osteoporosis with current pathological fracture - Primary     Other   Post-nasal drip   Relevant Medications   mometasone (NASONEX) 50 MCG/ACT nasal spray      Meds ordered this encounter  Medications  . mometasone (NASONEX) 50 MCG/ACT nasal spray    Sig: Place 2 sprays into the nose daily.    Dispense:  17 g    Refill:  12    Follow-up: Return in about 3 months (around 01/12/2020).  We will follow-up in 3 months for face-to-face visit and labs.  Libby Maw, MD   Virtual Visit via Video Note  I connected with Shelia Roth on 10/15/19 at  9:00 AM EST by a video enabled telemedicine application and verified that I am speaking with the correct person using two identifiers.  Location: Patient: home with grandchildren Provider:    I discussed the limitations of evaluation and management by telemedicine and the availability of in person appointments. The patient expressed understanding and agreed to proceed.  History of Present Illness:    Observations/Objective:   Assessment and Plan:   Follow Up Instructions:    I discussed the assessment  and treatment plan with the patient. The patient was provided an opportunity to ask questions and all were answered. The patient agreed with the plan and demonstrated an understanding of the instructions.  Interactive video and audio telecommunications were attempted between myself and the patient. However they failed due to the patient having technical difficulties or not having access to video capability. We continued and completed with audio only.   The patient was advised to call back or seek an in-person evaluation if the symptoms worsen or if the condition fails to improve as anticipated.  I provided 25 minutes of non-face-to-face time during this encounter.   Libby Maw, MD

## 2019-10-17 ENCOUNTER — Ambulatory Visit: Payer: Medicare Other | Attending: Internal Medicine

## 2019-10-17 DIAGNOSIS — Z23 Encounter for immunization: Secondary | ICD-10-CM

## 2019-10-17 NOTE — Progress Notes (Signed)
   Covid-19 Vaccination Clinic  Name:  Virtie Miskiewicz    MRN: [id number] DOB: Feb 25, 1946  10/17/2019  Ms. Marbach was observed post Covid-19 immunization for 30 minutes based on pre-vaccination screening without incidence. She was provided with Vaccine Information Sheet and instruction to access the V-Safe system.   Ms. Ghezzi was instructed to call 911 with any severe reactions post vaccine: Marland Kitchen Difficulty breathing  . Swelling of your face and throat  . A fast heartbeat  . A bad rash all over your body  . Dizziness and weakness    Immunizations Administered    Name Date Dose VIS Date Route   Pfizer COVID-19 Vaccine 10/17/2019  4:28 PM 0.3 mL 08/01/2019 Intramuscular   Manufacturer: [place]   Lot: [id number]   [place]: [id number]

## 2019-11-05 ENCOUNTER — Other Ambulatory Visit: Payer: Self-pay | Admitting: Family Medicine

## 2019-11-05 DIAGNOSIS — I480 Paroxysmal atrial fibrillation: Secondary | ICD-10-CM

## 2019-11-05 DIAGNOSIS — I519 Heart disease, unspecified: Secondary | ICD-10-CM

## 2019-11-05 DIAGNOSIS — I10 Essential (primary) hypertension: Secondary | ICD-10-CM

## 2019-11-12 ENCOUNTER — Ambulatory Visit: Payer: Medicare Other | Attending: Internal Medicine

## 2019-11-12 DIAGNOSIS — Z23 Encounter for immunization: Secondary | ICD-10-CM

## 2019-11-12 NOTE — Progress Notes (Signed)
   Covid-19 Vaccination Clinic  Name:  Krystalann Ballog    MRN: [id number] DOB: Dec 24, 1945  11/12/2019  Ms. Setter was observed post Covid-19 immunization for 30 minutes based on pre-vaccination screening without incident. She was provided with Vaccine Information Sheet and instruction to access the V-Safe system.   Ms. Rabadi was instructed to call 911 with any severe reactions post vaccine: Marland Kitchen Difficulty breathing  . Swelling of face and throat  . A fast heartbeat  . A bad rash all over body  . Dizziness and weakness   Immunizations Administered    Name Date Dose VIS Date Route   Pfizer COVID-19 Vaccine 11/12/2019  4:28 PM 0.3 mL 08/01/2019 Intramuscular   Manufacturer: [place]   Lot: [id number]   [employer]: [id number]

## 2019-11-13 ENCOUNTER — Other Ambulatory Visit: Payer: Self-pay | Admitting: Family Medicine

## 2019-12-06 ENCOUNTER — Other Ambulatory Visit: Payer: Self-pay | Admitting: Family Medicine

## 2019-12-06 DIAGNOSIS — M8000XS Age-related osteoporosis with current pathological fracture, unspecified site, sequela: Secondary | ICD-10-CM

## 2019-12-18 ENCOUNTER — Telehealth: Payer: Self-pay | Admitting: Family Medicine

## 2019-12-18 NOTE — Progress Notes (Signed)
  Chronic Care Management   Note  12/18/2019 Name: Shelia Roth MRN: [id number] DOB: [birth year]  Shelia Roth is a 74 y.o. year old female who is a primary care patient of Libby Maw, MD. I reached out to Shelia Roth by phone today in response to a referral sent by Shelia Roth PCP, Libby Maw, MD.   Shelia Roth was given information about Chronic Care Management services today including:  1. CCM service includes personalized support from designated clinical staff supervised by her physician, including individualized plan of care and coordination with other care providers 2. 24/7 contact phone numbers for assistance for urgent and routine care needs. 3. Service will only be billed when office clinical staff spend 20 minutes or more in a month to coordinate care. 4. Only one practitioner may furnish and bill the service in a calendar month. 5. The patient may stop CCM services at any time (effective at the end of the month) by phone call to the office staff.   Patient agreed to services and verbal consent obtained.   This note is not being shared with the patient for the following reason: To respect privacy (The patient or proxy has requested that the information not be shared).  Follow up plan:   Shelia Roth [facility]

## 2020-01-02 NOTE — Addendum Note (Signed)
Addended by: Lynda Rainwater on: 01/02/2020 12:01 PM   Modules accepted: Orders

## 2020-01-02 NOTE — Chronic Care Management (AMB) (Signed)
Chronic Care Management Pharmacy  Name: Shelia Roth  MRN: [id number] DOB: 1946/08/08  Chief Complaint/ HPI  Shelia Roth,  74 y.o. , female presents for their Initial CCM visit with the clinical pharmacist via telephone due to COVID-19 Pandemic.  PCP : Libby Maw, MD  Their chronic conditions include: Hypertension, Osteoporosis  Office Visits: 10/15/19: Patient presented to Dr. Ethelene Hal for Osteoporosis follow-up. Home BP 120s/60s. Patient started on Nasonex for postnasal drip.  05/14/19: Patient presented to Shelia Plummer, RN for AWV.   Consult Visit: 01/20/19: Patient presented to Dr. Quillian Quince for paroxysmal A-Fib follow-up. Eliquis discontinued as patient had not experienced atrial fibrillation in 4 years. Remains on Carvedilol for rate control.   Medications: Outpatient Encounter Medications as of 01/06/2020  Medication Sig  . alendronate (FOSAMAX) 70 MG tablet Take 1 tablet by mouth once a week  . amLODipine (NORVASC) 5 MG tablet TAKE 1 TABLET BY MOUTH ONCE DAILY  . Calcium Carb-Cholecalciferol (CALCIUM + VITAMIN D3 PO) Take 1 tablet by mouth 2 (two) times daily. Calcium 600mg  and Vitamin d3 26mcg  . carvedilol (COREG) 12.5 MG tablet TAKE 1 TABLET BY MOUTH TWICE DAILY**PLEASE SCHEDULE PHONE VISIT WITH PCP FOR REFILLS**  . diclofenac Sodium (VOLTAREN) 1 % GEL Apply 2 g topically 4 (four) times daily as needed.  Marland Kitchen losartan (COZAAR) 100 MG tablet Take 1 tablet by mouth daily.  . mometasone (NASONEX) 50 MCG/ACT nasal spray Place 2 sprays into the nose daily.  . Multiple Vitamins-Minerals (EMERGEN-C IMMUNE PLUS) PACK Take 1 Package by mouth daily.  . multivitamin-lutein (OCUVITE-LUTEIN) CAPS capsule Take 1 capsule by mouth daily.   No facility-administered encounter medications on file as of 01/06/2020.   Current Diagnosis/Assessment:  Goals Addressed            This Visit's Progress   . Chronic Care Management       CARE PLAN ENTRY  Current Barriers:   . Chronic Disease Management support, education, and care coordination needs related to Hypertension and Osteoporosis   Hypertension . Pharmacist Clinical Goal(s): o Over the next 180 days, patient will work with PharmD and providers to maintain BP goal <140/90 . Current regimen:  o Amlodipine 5 mg  o Carvedilol 12.5 mg  o Losartan 100 mg  . Patient self care activities - Over the next 180 days, patient will: o Check blood pressure weekly, document, and provide at future appointments o Ensure daily salt intake < 2300 mg/day o Slowly increase activity level, aiming for a goal of 150 minutes of moderate intensity exercise weekly  Osteoporosis . Pharmacist Clinical Goal(s) o Over the next 180 days, patient will work with PharmD and providers to prevent fractures . Current regimen:  o Alendronate 70 mg  o Calcium 600 mg + Vitamin D  . Interventions: o Increase calcium to twice daily o Counseled on oral bisphosphonate administration: take in the morning, 30 minutes prior to food with 6-8 oz of water. Do not lie down for at least 30 minutes after taking. . Patient self care activities - Over the next 180 days, patient will: o Maintain [phone] units of vitamin D daily o Achieve 1200 mg of calcium daily from dietary and supplemental sources  Medication management . Pharmacist Clinical Goal(s): o Over the next 180 days, patient will work with PharmD and providers to maintain optimal medication adherence . Current pharmacy: [facility] . Interventions o Comprehensive medication review performed. o Utilize [facility] for medication synchronization, packaging and delivery. Verbal consent obtained for [facility] enhanced pharmacy services (medication synchronization, adherence packaging, delivery coordination). A medication sync plan was created to allow patient to get all medications delivered once every 30 to 90 days per patient preference. Patient understands they have freedom to  choose pharmacy and clinical pharmacist will coordinate care between all prescribers and [facility].  . Patient self care activities - Over the next 180 days, patient will: o Focus on medication adherence by utilizing [facility]  o Take medications as prescribed o Report any questions or concerns to PharmD and/or provider(s)       Paroxysmal AFIB   Patient is currently rate controlled.  Patient has failed these meds in past: Eliquis (no longer needed) Patient is currently controlled on the following medications:   Carvedilol 12.5 mg BID   We discussed:  Last instance of A-Fib in 2016. Eliquis stopped June 2020 after no A-Fib detected on Zio monitor. Patient experiencing fatigue related to deconditioning. Tolerating carvedilol well.  Plan  Continue current medications  Hypertension   BP today is: 114/67, HR 77   Office blood pressures are  BP Readings from Last 3 Encounters:  10/08/18 126/72  09/03/18 120/76  10/01/17 128/76   CMP Latest Ref Rng & Units 09/03/2018 10/01/2017 01/10/2016  Glucose 70 - 99 mg/dL 93 88 -  BUN 6 - 23 mg/dL 22 27(H) 30(A)  Creatinine 0.40 - 1.20 mg/dL 0.79 0.78 0.8  Sodium 135 - 145 mEq/L 139 142 143  Potassium 3.5 - 5.1 mEq/L 4.2 4.0 3.6  Chloride 96 - 112 mEq/L 104 105 -  CO2 19 - 32 mEq/L 27 27 -  Calcium 8.4 - 10.5 mg/dL 9.0 9.1 -  Total Protein 6.0 - 8.3 g/dL 6.7 6.8 -  Total Bilirubin 0.2 - 1.2 mg/dL 0.6 0.6 -  Alkaline Phos 39 - 117 U/L 49 55 70  AST 0 - 37 U/L 10 12 13   ALT 0 - 35 U/L 9 10 9    Patient has failed these meds in the past: n/a Patient is currently controlled on the following medications:   Amlodipine 5 mg daily (AM)  Carvedilol 12.5 mg BID   Losartan 100 mg daily (AM)  Patient checks BP at home 1-2x per week. Uses a wrist cuff.   Patient home BP readings are ranging: 101/64, 124/71, 133/73, 106/62, 128/79   We discussed diet and exercise extensively. Patient reports one instance of potential  hypotension, felt tired, lightheaded. Resolved after lying down and drinking water for 30 minutes. Patient has had minimal activity recently, previously walking but has been limited due to hip/knee pain.   Plan  Continue current medications   Osteopenia / Osteoporosis   Last DEXA Scan: 06/10/19   T-Score femoral neck: -2.8  T-Score total hip: -2.8  T-Score lumbar spine: -1.2   VITD  Date Value Ref Range Status  09/03/2018 61.49 30.00 - 100.00 ng/mL Final     Patient is a candidate for pharmacologic treatment due to history of hip fracture   Patient has failed these meds in past: n/a Patient is currently controlled on the following medications:   Alendronate 70 mg weekly (thursdays)    Calcium + Vit D 600 mg-25 mcg (AM) We discussed:  Recommend [phone] units of vitamin D daily. Recommend 1200 mg of calcium daily from dietary and supplemental sources. Counseled on oral bisphosphonate administration: take in the morning, 30 minutes prior to food with 6-8 oz of water. Do not lie down for at least 30 minutes after taking.  Plan  Recommend increasing calcium to twice daily  Allergies   Patient has failed these meds in past: n/a Patient is currently controlled on the following medications:   Nasonex 44mcg/act 2 spray daily PRN  We discussed:  Reports significant improvement. Drainage, dry cough improved.   Plan  Continue current medications   Misc/ OTC   Dicolfenac 1% gel (uses once weekly) Emergen-C daily  Ocuvite daily   Plan  Continue current medications  Vaccines   Reviewed and discussed patient's vaccination history.    Immunization History  Administered Date(s) Administered  . Influenza, High Dose Seasonal PF 08/25/2018  . Influenza-Unspecified 05/21/2017, 08/21/2018, 06/01/2019  . PFIZER SARS-COV-2 Vaccination 10/17/2019, 11/12/2019  . Pneumococcal Conjugate-13 12/13/2014  . Pneumococcal Polysaccharide-23 10/29/2012  . Zoster Recombinat (Shingrix)  06/01/2019   Medication Management   Pt uses [facility] for all medications. No >5 gap noted on external fill history  Uses pill box? No - Hasn't needed Pt endorses 100% compliance  Plan  Utilize [facility] for medication synchronization, packaging and delivery. Verbal consent obtained for [facility] enhanced pharmacy services (medication synchronization, adherence packaging, delivery coordination). A medication sync plan was created to allow patient to get all medications delivered once every 30 to 90 days per patient preference. Patient understands they have freedom to choose pharmacy and clinical pharmacist will coordinate care between all prescribers and [facility].  Follow up: 6 month phone visit  [place] at [facility]  [phone]

## 2020-01-06 ENCOUNTER — Telehealth: Payer: Self-pay

## 2020-01-06 ENCOUNTER — Ambulatory Visit: Payer: Medicare Other

## 2020-01-06 DIAGNOSIS — I10 Essential (primary) hypertension: Secondary | ICD-10-CM

## 2020-01-06 DIAGNOSIS — M8000XS Age-related osteoporosis with current pathological fracture, unspecified site, sequela: Secondary | ICD-10-CM

## 2020-01-06 NOTE — Patient Instructions (Signed)
Visit Information It was great speaking with you today!  Please let me know if you have any questions about our visit.  Goals Addressed            This Visit's Progress   . Chronic Care Management       CARE PLAN ENTRY  Current Barriers:  . Chronic Disease Management support, education, and care coordination needs related to Hypertension and Osteoporosis   Hypertension . Pharmacist Clinical Goal(s): o Over the next 180 days, patient will work with PharmD and providers to maintain BP goal <140/90 . Current regimen:  o Amlodipine 5 mg  o Carvedilol 12.5 mg  o Losartan 100 mg  . Patient self care activities - Over the next 180 days, patient will: o Check blood pressure weekly, document, and provide at future appointments o Ensure daily salt intake < 2300 mg/day o Slowly increase activity level, aiming for a goal of 150 minutes of moderate intensity exercise weekly  Osteoporosis . Pharmacist Clinical Goal(s) o Over the next 180 days, patient will work with PharmD and providers to prevent fractures . Current regimen:  o Alendronate 70 mg  o Calcium 600 mg + Vitamin D  . Interventions: o Increase calcium to twice daily o Counseled on oral bisphosphonate administration: take in the morning, 30 minutes prior to food with 6-8 oz of water. Do not lie down for at least 30 minutes after taking. . Patient self care activities - Over the next 180 days, patient will: o Maintain [phone] units of vitamin D daily o Achieve 1200 mg of calcium daily from dietary and supplemental sources  Medication management . Pharmacist Clinical Goal(s): o Over the next 180 days, patient will work with PharmD and providers to maintain optimal medication adherence . Current pharmacy: [facility] . Interventions o Comprehensive medication review performed. o Utilize [facility] for medication synchronization, packaging and delivery. Verbal consent obtained for [facility] enhanced pharmacy services  (medication synchronization, adherence packaging, delivery coordination). A medication sync plan was created to allow patient to get all medications delivered once every 30 to 90 days per patient preference. Patient understands they have freedom to choose pharmacy and clinical pharmacist will coordinate care between all prescribers and [facility].  . Patient self care activities - Over the next 180 days, patient will: o Focus on medication adherence by utilizing [facility]  o Take medications as prescribed o Report any questions or concerns to PharmD and/or provider(s)        Ms. Merante was given information about Chronic Care Management services today including:  1. CCM service includes personalized support from designated clinical staff supervised by her physician, including individualized plan of care and coordination with other care providers 2. 24/7 contact phone numbers for assistance for urgent and routine care needs. 3. Standard insurance, coinsurance, copays and deductibles apply for chronic care management only during months in which we provide at least 20 minutes of these services. Most insurances cover these services at 100%, however patients may be responsible for any copay, coinsurance and/or deductible if applicable. This service may help you avoid the need for more expensive face-to-face services. 4. Only one practitioner may furnish and bill the service in a calendar month. 5. The patient may stop CCM services at any time (effective at the end of the month) by phone call to the office staff.  Patient agreed to services and verbal consent obtained.   The patient verbalized understanding of instructions provided today and agreed to receive a  mailed copy of patient instruction and/or educational materials. Telephone follow up appointment with pharmacy team member scheduled for: 07/05/20 at 10:00 AM  [place] at [facility]  [phone]

## 2020-01-06 NOTE — Telephone Encounter (Signed)
Shelia Roth,  The patient wishes to use [facility] going forward. To help [place] transition the patient to the pharmacy, can you please put in new refills of her chronic medications so that we may fill them for her the next time they are due?   Alendronate 70 mg  Amlodipine 5mg   Carvedilol 12.5 mg  Losartan 100 mg  Mometasone 50 mcg/act nasal spray   Thanks,  Doristine Section Clinical Pharmacist Velora Heckler at [facility]  [phone]

## 2020-01-12 ENCOUNTER — Other Ambulatory Visit: Payer: Self-pay

## 2020-01-13 ENCOUNTER — Encounter: Payer: Self-pay | Admitting: Family Medicine

## 2020-01-13 ENCOUNTER — Other Ambulatory Visit: Payer: Self-pay

## 2020-01-13 ENCOUNTER — Ambulatory Visit (INDEPENDENT_AMBULATORY_CARE_PROVIDER_SITE_OTHER): Payer: Medicare Other | Admitting: Family Medicine

## 2020-01-13 VITALS — BP 114/72 | HR 63 | Temp 97.7°F | Ht 65.0 in | Wt 168.6 lb

## 2020-01-13 DIAGNOSIS — E559 Vitamin D deficiency, unspecified: Secondary | ICD-10-CM | POA: Diagnosis not present

## 2020-01-13 DIAGNOSIS — I480 Paroxysmal atrial fibrillation: Secondary | ICD-10-CM | POA: Diagnosis not present

## 2020-01-13 DIAGNOSIS — M8000XS Age-related osteoporosis with current pathological fracture, unspecified site, sequela: Secondary | ICD-10-CM

## 2020-01-13 DIAGNOSIS — I10 Essential (primary) hypertension: Secondary | ICD-10-CM

## 2020-01-13 DIAGNOSIS — I519 Heart disease, unspecified: Secondary | ICD-10-CM

## 2020-01-13 DIAGNOSIS — D649 Anemia, unspecified: Secondary | ICD-10-CM | POA: Diagnosis not present

## 2020-01-13 DIAGNOSIS — R0982 Postnasal drip: Secondary | ICD-10-CM

## 2020-01-13 DIAGNOSIS — E7800 Pure hypercholesterolemia, unspecified: Secondary | ICD-10-CM | POA: Diagnosis not present

## 2020-01-13 LAB — COMPREHENSIVE METABOLIC PANEL
ALT: 10 U/L (ref 0–35)
AST: 13 U/L (ref 0–37)
Albumin: 4 g/dL (ref 3.5–5.2)
Alkaline Phosphatase: 62 U/L (ref 39–117)
BUN: 19 mg/dL (ref 6–23)
CO2: 29 mEq/L (ref 19–32)
Calcium: 9.2 mg/dL (ref 8.4–10.5)
Chloride: 105 mEq/L (ref 96–112)
Creatinine, Ser: 0.89 mg/dL (ref 0.40–1.20)
GFR: 62.2 mL/min (ref >60.00)
Glucose, Bld: 111 mg/dL — ABNORMAL HIGH (ref 70–99)
Potassium: 4.1 mEq/L (ref 3.5–5.1)
Sodium: 141 mEq/L (ref 135–145)
Total Bilirubin: 0.7 mg/dL (ref 0.2–1.2)
Total Protein: 6.6 g/dL (ref 6.0–8.3)

## 2020-01-13 LAB — LIPID PANEL
Cholesterol: 206 mg/dL — ABNORMAL HIGH (ref 0–200)
HDL: 53.3 mg/dL (ref >39.00)
LDL Cholesterol: 134 mg/dL — ABNORMAL HIGH (ref 0–99)
NonHDL: 152.29
Total CHOL/HDL Ratio: 4
Triglycerides: 90 mg/dL (ref 0.0–149.0)
VLDL: 18 mg/dL (ref 0.0–40.0)

## 2020-01-13 LAB — URINALYSIS, ROUTINE W REFLEX MICROSCOPIC
Bilirubin Urine: NEGATIVE
Ketones, ur: NEGATIVE
Leukocytes,Ua: NEGATIVE
Nitrite: NEGATIVE
Specific Gravity, Urine: 1.025 (ref 1.000–1.030)
Urine Glucose: NEGATIVE
Urobilinogen, UA: 0.2 (ref 0.0–1.0)
pH: 6 (ref 5.0–8.0)

## 2020-01-13 LAB — CBC
HCT: 35.4 % — ABNORMAL LOW (ref 36.0–46.0)
Hemoglobin: 12 g/dL (ref 12.0–15.0)
MCHC: 34 g/dL (ref 30.0–36.0)
MCV: 91.3 fl (ref 78.0–100.0)
Platelets: 298 10*3/uL (ref 150.0–400.0)
RBC: 3.87 Mil/uL (ref 3.87–5.11)
RDW: 12.9 % (ref 11.5–15.5)
WBC: 6 10*3/uL (ref 4.0–10.5)

## 2020-01-13 LAB — VITAMIN B12: Vitamin B-12: 333 pg/mL (ref 211–911)

## 2020-01-13 LAB — TSH: TSH: 0.48 u[IU]/mL (ref 0.35–4.50)

## 2020-01-13 MED ORDER — APIXABAN 5 MG PO TABS: 5.0000 mg | ORAL_TABLET | Freq: Two times a day (BID) | ORAL | 5 refills | Status: DC

## 2020-01-13 MED ORDER — MOMETASONE FUROATE 50 MCG/ACT NA SUSP: 2.0000 | Freq: Every day | NASAL | 12 refills | Status: DC

## 2020-01-13 MED ORDER — ALENDRONATE SODIUM 70 MG PO TABS: 70.0000 mg | ORAL_TABLET | ORAL | 0 refills | Status: DC

## 2020-01-13 MED ORDER — LOSARTAN POTASSIUM 100 MG PO TABS: 100.0000 mg | ORAL_TABLET | Freq: Every day | ORAL | 0 refills | Status: DC

## 2020-01-13 MED ORDER — AMLODIPINE BESYLATE 5 MG PO TABS: ORAL_TABLET | ORAL | 0 refills | Status: DC

## 2020-01-13 MED ORDER — CARVEDILOL 12.5 MG PO TABS: ORAL_TABLET | ORAL | 0 refills | Status: DC

## 2020-01-13 NOTE — Progress Notes (Signed)
Established Patient Office Visit  Subjective:  Patient ID: Shelia Roth, female    DOB: Aug 03, 1946  Age: 74 y.o. MRN: [id number]  CC:  Chief Complaint  Patient presents with  . Follow-up    3 month follow up on BP need lab work. No concerns.     HPI Shelia Roth presents for follow-up of her hypertension, vitamin D deficiency and elevated cholesterol.  Patient has been doing well.  Denies chest pain palpitations shortness of breath but has been experiencing some fatigue.  Saw pharmacist who recommended that she take her 1200 mg of calcium with her vitamin D and continue the Fosamax I agree.  Scheduled to see the dentist to complete dental extractions.  She is planning on follow-up with ophthalmology soon.  Had seen cardiology in [facility] over this past summer.  ZIO recorder confirmed regular rate and rhythm over the week and Eliquis was discontinued at that time.  Past Medical History:  Diagnosis Date  . Asthma   . Atrial fibrillation ([place])   . Chicken pox   . Gallstones   . Hyperlipidemia   . Hypertension   . Thyroid disease   . UTI (urinary tract infection)     Past Surgical History:  Procedure Laterality Date  . ABDOMINAL HYSTERECTOMY    . APPENDECTOMY  1984  . BREAST SURGERY    . CHOLECYSTECTOMY  1980  . COLONOSCOPY  2011   [facility]  . ESOPHAGOGASTRODUODENOSCOPY     right after gallbladder removal   . MASTECTOMY Bilateral    for mastitis. Says to do blood pressure on left   . PARTIAL HIP ARTHROPLASTY  2017   replacement of head per patient   . THYROIDECTOMY  1991    Family History  Problem Relation Age of Onset  . COPD Father   . Heart disease Father   . Hypertension Father   . Stomach cancer Father        said that they autospy didn't state but dr thinks he did have this. Died with internal bleeding and cardiac arrest   . Diabetes Brother   . Hyperlipidemia Brother   . Heart disease Daughter   . Stroke Maternal Grandmother   . Heart attack  Maternal Grandfather   . Heart attack Paternal Grandmother   . Early death Paternal Grandfather   . Colon cancer Neg Hx   . Esophageal cancer Neg Hx     Social History   Socioeconomic History  . Marital status: Divorced    Spouse name: Not on file  . Number of children: 1  . Years of education: Not on file  . Highest education level: Not on file  Occupational History  . Not on file  Tobacco Use  . Smoking status: Never Smoker  . Smokeless tobacco: Never Used  Substance and Sexual Activity  . Alcohol use: No  . Drug use: Never  . Sexual activity: Never  Other Topics Concern  . Not on file  Social History Narrative  . Not on file   Social Determinants of Health   Financial Resource Strain: Low Risk   . Difficulty of Paying Living Expenses: Not hard at all  Food Insecurity:   . Worried About Charity fundraiser in the Last Year:   . Arboriculturist in the Last Year:   Transportation Needs: No Transportation Needs  . Lack of Transportation (Medical): No  . Lack of Transportation (Non-Medical): No  Physical Activity:   . Days  of Exercise per Week:   . Minutes of Exercise per Session:   Stress:   . Feeling of Stress :   Social Connections:   . Frequency of Communication with Friends and Family:   . Frequency of Social Gatherings with Friends and Family:   . Attends Religious Services:   . Active Member of Clubs or Organizations:   . Attends Archivist Meetings:   Marland Kitchen Marital Status:   Intimate Partner Violence:   . Fear of Current or Ex-Partner:   . Emotionally Abused:   Marland Kitchen Physically Abused:   . Sexually Abused:     Outpatient Medications Prior to Visit  Medication Sig Dispense Refill  . alendronate (FOSAMAX) 70 MG tablet Take 1 tablet by mouth once a week 4 tablet 0  . amLODipine (NORVASC) 5 MG tablet TAKE 1 TABLET BY MOUTH ONCE DAILY 90 tablet 0  . Calcium Carb-Cholecalciferol (CALCIUM + VITAMIN D3 PO) Take 1 tablet by mouth 2 (two) times daily.  Calcium 600mg  and Vitamin d3 14mcg    . carvedilol (COREG) 12.5 MG tablet TAKE 1 TABLET BY MOUTH TWICE DAILY**PLEASE SCHEDULE PHONE VISIT WITH PCP FOR REFILLS** 180 tablet 0  . diclofenac Sodium (VOLTAREN) 1 % GEL Apply 2 g topically 4 (four) times daily as needed.    Marland Kitchen losartan (COZAAR) 100 MG tablet Take 1 tablet by mouth daily.    . mometasone (NASONEX) 50 MCG/ACT nasal spray Place 2 sprays into the nose daily. 17 g 12  . Multiple Vitamins-Minerals (EMERGEN-C IMMUNE PLUS) PACK Take 1 Package by mouth daily.    . multivitamin-lutein (OCUVITE-LUTEIN) CAPS capsule Take 1 capsule by mouth daily.     No facility-administered medications prior to visit.    Allergies  Allergen Reactions  . Codeine Shortness Of Breath    ROS Review of Systems  Constitutional: Negative.   HENT: Negative.   Eyes: Negative for photophobia and visual disturbance.  Respiratory: Negative.  Negative for chest tightness and shortness of breath.   Cardiovascular: Negative for chest pain and palpitations.  Endocrine: Negative for polyphagia and polyuria.  Genitourinary: Negative.   Musculoskeletal: Negative for gait problem and joint swelling.  Skin: Negative for pallor and rash.  Allergic/Immunologic: Negative for immunocompromised state.  Neurological: Negative for light-headedness and headaches.  Hematological: Does not bruise/bleed easily.  Psychiatric/Behavioral: Negative.       Objective:    Physical Exam  Constitutional: She is oriented to person, place, and time. She appears well-developed and well-nourished. No distress.  HENT:  Head: Normocephalic and atraumatic.  Right Ear: External ear normal.  Left Ear: External ear normal.  Eyes: Conjunctivae are normal. Right eye exhibits no discharge. Left eye exhibits no discharge. No scleral icterus.  Neck: No JVD present. No tracheal deviation present.  Cardiovascular: An irregularly irregular rhythm present.  Pulmonary/Chest: Effort normal and breath  sounds normal. No stridor.  Musculoskeletal:     Right lower leg: No edema.     Left lower leg: No edema.  Neurological: She is alert and oriented to person, place, and time.  Skin: She is not diaphoretic.  Psychiatric: She has a normal mood and affect. Her speech is normal and behavior is normal.    BP 114/72   Pulse 63   Temp 97.7 F (36.5 C) (Tympanic)   Ht 5\' 5"  (1.651 m)   Wt 168 lb 9.6 oz (76.5 kg)   LMP  (LMP Unknown)   SpO2 97%   BMI 28.06 kg/m  Wt Readings from  Last 3 Encounters:  01/13/20 168 lb 9.6 oz (76.5 kg)  10/15/19 160 lb (72.6 kg)  10/08/18 160 lb 2 oz (72.6 kg)     Health Maintenance Due  Topic Date Due  . Hepatitis C Screening  Never done    There are no preventive care reminders to display for this patient.  No results found for: TSH Lab Results  Component Value Date   WBC 4.1 09/03/2018   HGB 11.8 (L) 09/03/2018   HCT 34.6 (L) 09/03/2018   MCV 90.7 09/03/2018   PLT 263.0 09/03/2018   Lab Results  Component Value Date   NA 139 09/03/2018   K 4.2 09/03/2018   CO2 27 09/03/2018   GLUCOSE 93 09/03/2018   BUN 22 09/03/2018   CREATININE 0.79 09/03/2018   BILITOT 0.6 09/03/2018   ALKPHOS 49 09/03/2018   AST 10 09/03/2018   ALT 9 09/03/2018   PROT 6.7 09/03/2018   ALBUMIN 3.9 09/03/2018   CALCIUM 9.0 09/03/2018   GFR 76.15 09/03/2018   Lab Results  Component Value Date   CHOL 203 (H) 09/03/2018   Lab Results  Component Value Date   HDL 51.50 09/03/2018   Lab Results  Component Value Date   LDLCALC 130 (H) 09/03/2018   Lab Results  Component Value Date   TRIG 105.0 09/03/2018   Lab Results  Component Value Date   CHOLHDL 4 09/03/2018   No results found for: HGBA1C    Assessment & Plan:   Problem List Items Addressed This Visit      Cardiovascular and Mediastinum   Paroxysmal atrial fibrillation (HCC)   Relevant Medications   apixaban (ELIQUIS) 5 MG TABS tablet   Other Relevant Orders   EKG 12-Lead   CBC    Ambulatory referral to Cardiology   Essential hypertension - Primary   Relevant Medications   apixaban (ELIQUIS) 5 MG TABS tablet   Other Relevant Orders   Comprehensive metabolic panel   Urinalysis, Routine w reflex microscopic   CBC     Musculoskeletal and Integument   Age-related osteoporosis with current pathological fracture   Relevant Orders   CBC     Other   Vitamin D deficiency   Relevant Orders   CBC   Elevated LDL cholesterol level   Relevant Orders   Comprehensive metabolic panel   Lipid panel   CBC   Anemia   Relevant Orders   Vitamin B12   Iron, TIBC and Ferritin Panel   CBC   TSH    The 10-year ASCVD risk score Mikey Bussing DC Jr., et al., 2013) is: 12.6%   Values used to calculate the score:     Age: 41 years     Sex: Female     Is Non-Hispanic African American: No     Diabetic: No     Tobacco smoker: No     Systolic Blood Pressure: 99991111 mmHg     Is BP treated: Yes     HDL Cholesterol: 51.5 mg/dL     Total Cholesterol: 203 mg/dL  Meds ordered this encounter  Medications  . apixaban (ELIQUIS) 5 MG TABS tablet    Sig: Take 1 tablet (5 mg total) by mouth 2 (two) times daily.    Dispense:  60 tablet    Refill:  5    Follow-up: Return in about 3 months (around 04/14/2020).   Patient will be sure to take 1200 mg of calcium with her about vitamin D daily.  Continue Fosamax.  Today's EKG confirms onset of atrial fib.  Will restart Eliquis.  She would prefer to continue with her cardiologist in [facility].  Pending today's lipid profile is okay with starting a statin. Libby Maw, MD

## 2020-01-13 NOTE — Telephone Encounter (Signed)
Done

## 2020-01-14 LAB — IRON,TIBC AND FERRITIN PANEL
%SAT: 36 % (calc) (ref 16–45)
Ferritin: 70 ng/mL (ref 16–288)
Iron: 111 ug/dL (ref 45–160)
TIBC: 305 mcg/dL (calc) (ref 250–450)

## 2020-02-03 ENCOUNTER — Encounter: Payer: Self-pay | Admitting: Family Medicine

## 2020-02-03 DIAGNOSIS — I519 Heart disease, unspecified: Secondary | ICD-10-CM | POA: Diagnosis not present

## 2020-02-03 DIAGNOSIS — I447 Left bundle-branch block, unspecified: Secondary | ICD-10-CM | POA: Diagnosis not present

## 2020-02-03 DIAGNOSIS — D649 Anemia, unspecified: Secondary | ICD-10-CM | POA: Diagnosis not present

## 2020-02-03 DIAGNOSIS — I119 Hypertensive heart disease without heart failure: Secondary | ICD-10-CM | POA: Diagnosis not present

## 2020-02-03 DIAGNOSIS — I480 Paroxysmal atrial fibrillation: Secondary | ICD-10-CM | POA: Diagnosis not present

## 2020-02-03 NOTE — Progress Notes (Signed)
WF/thx dmf

## 2020-02-04 DIAGNOSIS — I493 Ventricular premature depolarization: Secondary | ICD-10-CM | POA: Diagnosis not present

## 2020-02-04 DIAGNOSIS — I447 Left bundle-branch block, unspecified: Secondary | ICD-10-CM | POA: Diagnosis not present

## 2020-03-01 ENCOUNTER — Telehealth: Payer: Self-pay

## 2020-03-01 ENCOUNTER — Ambulatory Visit: Payer: Self-pay

## 2020-03-01 DIAGNOSIS — M8000XS Age-related osteoporosis with current pathological fracture, unspecified site, sequela: Secondary | ICD-10-CM

## 2020-03-01 MED ORDER — ALENDRONATE SODIUM 70 MG PO TABS: 70.0000 mg | ORAL_TABLET | ORAL | 0 refills | Status: DC

## 2020-03-01 NOTE — Telephone Encounter (Signed)
Received call from patient requesting advice regarding upcoming procedure. She is scheduled to have multiple teeth removed, but she has an abscess in her tooth. She is requesting Amoxicillin to treat the abscess, as well as something to help with her pain. She states she has been using tylenol which has been ineffective to help her pain so she has been using ibuprofen, but is worried about the bleeding risk with Eliquis.   Dr. Ethelene Hal,  Please advise of the above. Do you think the patient should hold her Eliquis prior to her dental procedure? Typically for a single tooth removal they don't recommend stopping anticoagulation, but I was not sure if you thought differently since she was having multiple teeth pulled?   Thanks, Doristine Section Clinical Pharmacist Velora Heckler at [facility]  [phone]

## 2020-03-01 NOTE — Telephone Encounter (Signed)
Patient is requesting refill of Alendronate 70 mg weekly be sent to [facility].

## 2020-03-01 NOTE — Telephone Encounter (Signed)
May send the fosamax in for her please. She should follow up with her dentist about the amoxil and pain med.

## 2020-03-01 NOTE — Addendum Note (Signed)
Addended by: Magdalene Molly A on: 03/01/2020 11:01 AM   Modules accepted: Orders

## 2020-03-01 NOTE — Telephone Encounter (Signed)
Spoke with patient regarding upcoming dental procedure. Patient is not established with dentist and goes to walk-in clinic. They advised her to reach out to PCP's office to get antibiotic prescription.   Informed patient to schedule visit at office to have dental abscess and dental pain evaluated by Dr. Ethelene Hal or another provider.   [place] at [facility]  [phone]

## 2020-03-01 NOTE — Telephone Encounter (Signed)
I have sent the fosamax to the patients pharmacy. Dr. Ethelene Hal still see message below about antibiotics

## 2020-03-02 NOTE — Telephone Encounter (Signed)
Need to set up for a virtual with VIDEO.

## 2020-03-02 NOTE — Telephone Encounter (Signed)
Schedule for ov, virtual okay.

## 2020-03-02 NOTE — Telephone Encounter (Signed)
Spoke with patient who states that she ended up going to urgent care last night and they gave her antibiotics

## 2020-03-03 DIAGNOSIS — K047 Periapical abscess without sinus: Secondary | ICD-10-CM | POA: Diagnosis not present

## 2020-03-08 DIAGNOSIS — H2513 Age-related nuclear cataract, bilateral: Secondary | ICD-10-CM | POA: Diagnosis not present

## 2020-03-09 ENCOUNTER — Telehealth: Payer: Self-pay | Admitting: Family Medicine

## 2020-03-09 NOTE — Telephone Encounter (Signed)
Patient called and wanted to see if she needed to stop taking Eliquis before she gets her tooth extraction on 7/30, please advise. CB is [phone]

## 2020-03-09 NOTE — Telephone Encounter (Signed)
I spoke with pt and asked her to speak with dentist to get advised on stopping medication or not and call office back to let Dr. Ethelene Hal know.

## 2020-03-25 ENCOUNTER — Telehealth: Payer: Self-pay

## 2020-03-25 DIAGNOSIS — I10 Essential (primary) hypertension: Secondary | ICD-10-CM

## 2020-03-25 DIAGNOSIS — M8000XS Age-related osteoporosis with current pathological fracture, unspecified site, sequela: Secondary | ICD-10-CM

## 2020-03-25 DIAGNOSIS — I519 Heart disease, unspecified: Secondary | ICD-10-CM | POA: Diagnosis not present

## 2020-03-25 DIAGNOSIS — I480 Paroxysmal atrial fibrillation: Secondary | ICD-10-CM | POA: Diagnosis not present

## 2020-03-25 NOTE — Progress Notes (Addendum)
Chronic Care Management Pharmacy Assistant   Name: Shelia Roth  MRN: [id number] DOB: 21-Jul-1946  Reason for Encounter: Medication Review  Patient Questions:  1.  Have you seen any other providers since your last visit? Yes, 01/13/2020-PCP ,02/03/2020- Caridology   2.  Any changes in your medicines or health? Yes, 02/03/2020 PCP restarted Eliquis 5 mg Twice daily.    PCP : Libby Maw, MD  Allergies:   Allergies  Allergen Reactions  . Codeine Shortness Of Breath    Medications: Outpatient Encounter Medications as of 03/25/2020  Medication Sig  . alendronate (FOSAMAX) 70 MG tablet Take 1 tablet (70 mg total) by mouth once a week. Take with a full glass of water on an empty stomach.  Marland Kitchen amLODipine (NORVASC) 5 MG tablet TAKE 1 TABLET BY MOUTH ONCE DAILY  . apixaban (ELIQUIS) 5 MG TABS tablet Take 1 tablet (5 mg total) by mouth 2 (two) times daily.  . Calcium Carb-Cholecalciferol (CALCIUM + VITAMIN D3 PO) Take 1 tablet by mouth 2 (two) times daily. Calcium 600mg  and Vitamin d3 22mcg  . carvedilol (COREG) 12.5 MG tablet TAKE 1 TABLET BY MOUTH TWICE DAILY**PLEASE SCHEDULE PHONE VISIT WITH PCP FOR REFILLS**  . diclofenac Sodium (VOLTAREN) 1 % GEL Apply 2 g topically 4 (four) times daily as needed.  Marland Kitchen losartan (COZAAR) 100 MG tablet Take 1 tablet (100 mg total) by mouth daily.  . mometasone (NASONEX) 50 MCG/ACT nasal spray Place 2 sprays into the nose daily.  . Multiple Vitamins-Minerals (EMERGEN-C IMMUNE PLUS) PACK Take 1 Package by mouth daily.  . multivitamin-lutein (OCUVITE-LUTEIN) CAPS capsule Take 1 capsule by mouth daily.   No facility-administered encounter medications on file as of 03/25/2020.    Current Diagnosis: Patient Active Problem List   Diagnosis Date Noted  . Elevated LDL cholesterol level 01/13/2020  . Anemia 01/13/2020  . Post-nasal drip 10/15/2019  . Screen for colon cancer 09/03/2018  . Age-related osteoporosis with current pathological fracture  10/01/2017  . Vitamin D deficiency 10/01/2017  . Paroxysmal atrial fibrillation ([place]) 10/01/2017  . Essential hypertension 10/01/2017  . LV dysfunction 10/01/2017    Goals Addressed   None     Follow-Up:  Pharmacist Review   Reviewed chart for medication changes ahead of medication coordination call.  OVs, Consults, or hospital visits since last care coordination call/Pharmacist visit.    01/13/2020 -PCP Libby Maw - Restarted Eliquis 5 mg tablet daily   02/03/2020-Cardiology [facility] [place] - No medications changes noted  BP Readings from Last 3 Encounters:  01/13/20 114/72  10/08/18 126/72  09/03/18 120/76    No results found for: HGBA1C   Patient obtains medications through Adherence Packaging  30 Days   Last adherence delivery included: None ID  Patient is due for next adherence delivery on: 04/01/2020. Called patient and reviewed medications and coordinated delivery.  This delivery to include: Alendronate (FosaMax) 70 Mg Tablet Weekly on Thursdays - Breakfast  Amlodipine 5 MG tablet Daily -Breakfast Eliquis 5 MG Tablet Twice daily- Breakfast and bedtime Carvedilol 12.5mg  Tablet Twice daily- Breakfast and bedtime Losartan 100 mg Tablet - Breakfast Mometasone (Nasonex) 50 MCG/ACT Nasal spray - 2 sprays daily  Patient declined the following medications (meds) due to (reason) Voltaren 1% Gel PRN- (adequate Supply)  Patient needs refills for Alendronate 70 mg tablet, Amlodipine 5 mg tablet, carvedilol 12.5 Mg Tablet.  Confirmed delivery date of 04/01/2020, advised patient that pharmacy will contact them the morning of delivery.  [place] Pharmacist Assistant  [phone]   

## 2020-03-30 ENCOUNTER — Other Ambulatory Visit: Payer: Self-pay | Admitting: Family Medicine

## 2020-03-30 DIAGNOSIS — I519 Heart disease, unspecified: Secondary | ICD-10-CM

## 2020-03-30 DIAGNOSIS — I10 Essential (primary) hypertension: Secondary | ICD-10-CM

## 2020-03-30 DIAGNOSIS — I480 Paroxysmal atrial fibrillation: Secondary | ICD-10-CM

## 2020-03-30 DIAGNOSIS — M8000XS Age-related osteoporosis with current pathological fracture, unspecified site, sequela: Secondary | ICD-10-CM

## 2020-04-13 ENCOUNTER — Other Ambulatory Visit: Payer: Self-pay

## 2020-04-14 ENCOUNTER — Encounter: Payer: Self-pay | Admitting: Family Medicine

## 2020-04-14 ENCOUNTER — Other Ambulatory Visit: Payer: Self-pay

## 2020-04-14 ENCOUNTER — Ambulatory Visit (INDEPENDENT_AMBULATORY_CARE_PROVIDER_SITE_OTHER): Payer: Medicare Other | Admitting: Family Medicine

## 2020-04-14 VITALS — BP 114/68 | HR 62 | Temp 97.3°F | Ht 65.0 in | Wt 171.0 lb

## 2020-04-14 DIAGNOSIS — E611 Iron deficiency: Secondary | ICD-10-CM

## 2020-04-14 DIAGNOSIS — D649 Anemia, unspecified: Secondary | ICD-10-CM

## 2020-04-14 DIAGNOSIS — I480 Paroxysmal atrial fibrillation: Secondary | ICD-10-CM

## 2020-04-14 DIAGNOSIS — E559 Vitamin D deficiency, unspecified: Secondary | ICD-10-CM

## 2020-04-14 LAB — CBC
HCT: 36.5 % (ref 36.0–46.0)
Hemoglobin: 11.9 g/dL — ABNORMAL LOW (ref 12.0–15.0)
MCHC: 32.5 g/dL (ref 30.0–36.0)
MCV: 93.1 fl (ref 78.0–100.0)
Platelets: 285 10*3/uL (ref 150.0–400.0)
RBC: 3.92 Mil/uL (ref 3.87–5.11)
RDW: 13.5 % (ref 11.5–15.5)
WBC: 5.4 10*3/uL (ref 4.0–10.5)

## 2020-04-14 LAB — VITAMIN B12: Vitamin B-12: 409 pg/mL (ref 211–911)

## 2020-04-14 LAB — VITAMIN D 25 HYDROXY (VIT D DEFICIENCY, FRACTURES): VITD: 59 ng/mL (ref 30.00–100.00)

## 2020-04-14 MED ORDER — AMLODIPINE BESYLATE 5 MG PO TABS: ORAL_TABLET | ORAL | 0 refills | Status: DC

## 2020-04-14 NOTE — Progress Notes (Signed)
Established Patient Office Visit  Subjective:  Patient ID: Shelia Roth, female    DOB: Nov 20, 1945  Age: 74 y.o. MRN: [id number]  CC:  Chief Complaint  Patient presents with  . Follow-up    3 month follow up on BP, patient not sure if she should stop Eliquis prior to having teeth puulled     HPI Shelia Roth presents for follow-up of hypertension, PAF, B12 deficiency iron deficiency and anemia.  Needs to have teeth pulled and needs advice on apixaban management prior to and after surgery.  Her atrial fib is paroxysmal.  Denies blood in her urine or stool.  Needs all teeth pulled.  This will be done with multiple procedures.  Blood pressure and rate control with amlodipine, carvedilol and losartan.  She is taking a multivitamin and iron.  Past Medical History:  Diagnosis Date  . Asthma   . Atrial fibrillation ([place])   . Chicken pox   . Gallstones   . Hyperlipidemia   . Hypertension   . Thyroid disease   . UTI (urinary tract infection)     Past Surgical History:  Procedure Laterality Date  . ABDOMINAL HYSTERECTOMY    . APPENDECTOMY  1984  . BREAST SURGERY    . CHOLECYSTECTOMY  1980  . COLONOSCOPY  2011   [facility]  . ESOPHAGOGASTRODUODENOSCOPY     right after gallbladder removal   . MASTECTOMY Bilateral    for mastitis. Says to do blood pressure on left   . PARTIAL HIP ARTHROPLASTY  2017   replacement of head per patient   . THYROIDECTOMY  1991    Family History  Problem Relation Age of Onset  . COPD Father   . Heart disease Father   . Hypertension Father   . Stomach cancer Father        said that they autospy didn't state but dr thinks he did have this. Died with internal bleeding and cardiac arrest   . Diabetes Brother   . Hyperlipidemia Brother   . Heart disease Daughter   . Stroke Maternal Grandmother   . Heart attack Maternal Grandfather   . Heart attack Paternal Grandmother   . Early death Paternal Grandfather   . Colon cancer Neg Hx   .  Esophageal cancer Neg Hx     Social History   Socioeconomic History  . Marital status: Divorced    Spouse name: Not on file  . Number of children: 1  . Years of education: Not on file  . Highest education level: Not on file  Occupational History  . Not on file  Tobacco Use  . Smoking status: Never Smoker  . Smokeless tobacco: Never Used  Vaping Use  . Vaping Use: Never used  Substance and Sexual Activity  . Alcohol use: No  . Drug use: Never  . Sexual activity: Never  Other Topics Concern  . Not on file  Social History Narrative  . Not on file   Social Determinants of Health   Financial Resource Strain: Low Risk   . Difficulty of Paying Living Expenses: Not hard at all  Food Insecurity:   . Worried About Charity fundraiser in the Last Year: Not on file  . Ran Out of Food in the Last Year: Not on file  Transportation Needs: No Transportation Needs  . Lack of Transportation (Medical): No  . Lack of Transportation (Non-Medical): No  Physical Activity:   . Days of Exercise per Week: Not on file  .  Minutes of Exercise per Session: Not on file  Stress:   . Feeling of Stress : Not on file  Social Connections:   . Frequency of Communication with Friends and Family: Not on file  . Frequency of Social Gatherings with Friends and Family: Not on file  . Attends Religious Services: Not on file  . Active Member of Clubs or Organizations: Not on file  . Attends Archivist Meetings: Not on file  . Marital Status: Not on file  Intimate Partner Violence:   . Fear of Current or Ex-Partner: Not on file  . Emotionally Abused: Not on file  . Physically Abused: Not on file  . Sexually Abused: Not on file    Outpatient Medications Prior to Visit  Medication Sig Dispense Refill  . alendronate (FOSAMAX) 70 MG tablet TAKE ONE TABLET BY MOUTH ONCE A WEEK WITH A full GLASS of water ON an EMPTY stomach 4 tablet 5  . amLODipine (NORVASC) 5 MG tablet TAKE 1 TABLET BY MOUTH ONCE  DAILY 90 tablet 0  . Calcium Carb-Cholecalciferol (CALCIUM + VITAMIN D3 PO) Take 1 tablet by mouth 2 (two) times daily. Calcium 600mg  and Vitamin d3 8mcg    . carvedilol (COREG) 12.5 MG tablet TAKE 1 TABLET BY MOUTH TWICE DAILY**PLEASE SCHEDULE PHONE VISIT WITH PCP FOR REFILLS** 180 tablet 0  . diclofenac Sodium (VOLTAREN) 1 % GEL Apply 2 g topically 4 (four) times daily as needed.    Marland Kitchen losartan (COZAAR) 100 MG tablet Take 1 tablet (100 mg total) by mouth daily. 90 tablet 0  . mometasone (NASONEX) 50 MCG/ACT nasal spray Place 2 sprays into the nose daily. 17 g 12  . Multiple Vitamins-Minerals (EMERGEN-C IMMUNE PLUS) PACK Take 1 Package by mouth daily.    . multivitamin-lutein (OCUVITE-LUTEIN) CAPS capsule Take 1 capsule by mouth daily.     Marland Kitchen apixaban (ELIQUIS) 5 MG TABS tablet Take 1 tablet (5 mg total) by mouth 2 (two) times daily. 60 tablet 5   No facility-administered medications prior to visit.    Allergies  Allergen Reactions  . Codeine Shortness Of Breath    ROS Review of Systems  Constitutional: Negative.   HENT: Positive for dental problem.   Eyes: Negative for photophobia and visual disturbance.  Respiratory: Negative.   Cardiovascular: Negative.  Negative for palpitations.  Gastrointestinal: Negative.  Negative for anal bleeding and blood in stool.  Endocrine: Negative for polyphagia and polyuria.  Genitourinary: Negative.  Negative for hematuria.  Musculoskeletal: Negative for gait problem and joint swelling.  Allergic/Immunologic: Negative for immunocompromised state.  Neurological: Negative for light-headedness and numbness.  Hematological: Does not bruise/bleed easily.  Psychiatric/Behavioral: Negative.       Objective:    Physical Exam Vitals and nursing note reviewed.  Constitutional:      General: She is not in acute distress.    Appearance: Normal appearance. She is normal weight. She is not ill-appearing, toxic-appearing or diaphoretic.  HENT:     Head:  Normocephalic and atraumatic.     Right Ear: Tympanic membrane, ear canal and external ear normal.     Left Ear: Tympanic membrane, ear canal and external ear normal.  Eyes:     General: No scleral icterus.       Right eye: No discharge.        Left eye: No discharge.     Conjunctiva/sclera: Conjunctivae normal.  Cardiovascular:     Rate and Rhythm: Rhythm irregularly irregular.  Pulmonary:     Effort:  Pulmonary effort is normal.     Breath sounds: Normal breath sounds.  Abdominal:     General: Bowel sounds are normal.  Musculoskeletal:     Cervical back: No rigidity or tenderness.     Right lower leg: No edema.     Left lower leg: No edema.  Lymphadenopathy:     Cervical: No cervical adenopathy.  Skin:    General: Skin is warm and dry.  Neurological:     Mental Status: She is alert and oriented to person, place, and time.  Psychiatric:        Mood and Affect: Mood normal.        Behavior: Behavior normal.     BP 114/68   Pulse 62   Temp (!) 97.3 F (36.3 C) (Tympanic)   Ht 5\' 5"  (1.651 m)   Wt 171 lb (77.6 kg)   LMP  (LMP Unknown)   SpO2 95%   BMI 28.46 kg/m  Wt Readings from Last 3 Encounters:  04/14/20 171 lb (77.6 kg)  01/13/20 168 lb 9.6 oz (76.5 kg)  10/15/19 160 lb (72.6 kg)     Health Maintenance Due  Topic Date Due  . Hepatitis C Screening  Never done  . COLONOSCOPY  02/17/2020  . INFLUENZA VACCINE  03/21/2020    There are no preventive care reminders to display for this patient.  Lab Results  Component Value Date   TSH 0.48 01/13/2020   Lab Results  Component Value Date   WBC 6.0 01/13/2020   HGB 12.0 01/13/2020   HCT 35.4 (L) 01/13/2020   MCV 91.3 01/13/2020   PLT 298.0 01/13/2020   Lab Results  Component Value Date   NA 141 01/13/2020   K 4.1 01/13/2020   CO2 29 01/13/2020   GLUCOSE 111 (H) 01/13/2020   BUN 19 01/13/2020   CREATININE 0.89 01/13/2020   BILITOT 0.7 01/13/2020   ALKPHOS 62 01/13/2020   AST 13 01/13/2020   ALT 10  01/13/2020   PROT 6.6 01/13/2020   ALBUMIN 4.0 01/13/2020   CALCIUM 9.2 01/13/2020   GFR 62.20 01/13/2020   Lab Results  Component Value Date   CHOL 206 (H) 01/13/2020   Lab Results  Component Value Date   HDL 53.30 01/13/2020   Lab Results  Component Value Date   LDLCALC 134 (H) 01/13/2020   Lab Results  Component Value Date   TRIG 90.0 01/13/2020   Lab Results  Component Value Date   CHOLHDL 4 01/13/2020   No results found for: HGBA1C    Assessment & Plan:   Problem List Items Addressed This Visit      Cardiovascular and Mediastinum   PAF (paroxysmal atrial fibrillation) ([place]) - Primary     Other   Vitamin D deficiency   Relevant Orders   VITAMIN D 25 Hydroxy (Vit-D Deficiency, Fractures)   Anemia   Relevant Orders   CBC   Vitamin B12   Iron deficiency   Relevant Orders   Iron, TIBC and Ferritin Panel      No orders of the defined types were placed in this encounter.   Follow-up: No follow-ups on file.  Patient advised to stop apixaban 1 day prior to extraction and restarted on day after extraction.  She will follow these guidelines for multiple procedures at least a week apart.  Continue current medications.  May be able to discontinue iron.  Libby Maw, MD

## 2020-04-15 LAB — IRON,TIBC AND FERRITIN PANEL
%SAT: 35 % (calc) (ref 16–45)
Ferritin: 61 ng/mL (ref 16–288)
Iron: 118 ug/dL (ref 45–160)
TIBC: 333 mcg/dL (calc) (ref 250–450)

## 2020-04-21 ENCOUNTER — Telehealth: Payer: Self-pay

## 2020-04-21 DIAGNOSIS — I10 Essential (primary) hypertension: Secondary | ICD-10-CM

## 2020-04-21 DIAGNOSIS — I480 Paroxysmal atrial fibrillation: Secondary | ICD-10-CM

## 2020-04-21 NOTE — Progress Notes (Addendum)
Chronic Care Management Pharmacy Assistant   Name: Shelia Roth  MRN: [id number] DOB: 1945-10-25  Reason for Encounter: Medication Review   PCP : Libby Maw, MD  Allergies:   Allergies  Allergen Reactions  . Codeine Shortness Of Breath    Medications: Outpatient Encounter Medications as of 04/21/2020  Medication Sig  . alendronate (FOSAMAX) 70 MG tablet TAKE ONE TABLET BY MOUTH ONCE A WEEK WITH A full GLASS of water ON an EMPTY stomach  . amLODipine (NORVASC) 5 MG tablet TAKE 1 TABLET BY MOUTH ONCE DAILY  . apixaban (ELIQUIS) 5 MG TABS tablet Take 1 tablet (5 mg total) by mouth 2 (two) times daily.  . Calcium Carb-Cholecalciferol (CALCIUM + VITAMIN D3 PO) Take 1 tablet by mouth 2 (two) times daily. Calcium 600mg  and Vitamin d3 13mcg  . carvedilol (COREG) 12.5 MG tablet TAKE 1 TABLET BY MOUTH TWICE DAILY**PLEASE SCHEDULE PHONE VISIT WITH PCP FOR REFILLS**  . diclofenac Sodium (VOLTAREN) 1 % GEL Apply 2 g topically 4 (four) times daily as needed.  Marland Kitchen losartan (COZAAR) 100 MG tablet Take 1 tablet (100 mg total) by mouth daily.  . mometasone (NASONEX) 50 MCG/ACT nasal spray Place 2 sprays into the nose daily.  . Multiple Vitamins-Minerals (EMERGEN-C IMMUNE PLUS) PACK Take 1 Package by mouth daily.  . multivitamin-lutein (OCUVITE-LUTEIN) CAPS capsule Take 1 capsule by mouth daily.    No facility-administered encounter medications on file as of 04/21/2020.    Current Diagnosis: Patient Active Problem List   Diagnosis Date Noted  . Iron deficiency 04/14/2020  . Elevated LDL cholesterol level 01/13/2020  . Anemia 01/13/2020  . Post-nasal drip 10/15/2019  . Screen for colon cancer 09/03/2018  . Age-related osteoporosis with current pathological fracture 10/01/2017  . Vitamin D deficiency 10/01/2017  . PAF (paroxysmal atrial fibrillation) ([place]) 10/01/2017  . Essential hypertension 10/01/2017  . LV dysfunction 10/01/2017    Goals Addressed   None     Follow-Up:   Pharmacist Review   Reviewed chart for medication changes ahead of medication coordination call.  04/14/2020 PCP Abelino Derrick- Patient has multiple tooth extractions planned. Patient instructed to stop apixaban 1 day prior to extraction and restarted on day after extraction.  No medication changes indicated.  BP Readings from Last 3 Encounters:  04/14/20 114/68  01/13/20 114/72  10/08/18 126/72    No results found for: HGBA1C   Patient states she checks her Blood pressure once a week.   On 04/16/2020 it was 117/62  On 04/09/2020 it was 114/68  On 04/03/2020 it was 122/64  Patient obtains medications through Adherence Packaging  30 Days   Last adherence delivery included: (medication name and frequency)  Alendronate (FosaMax) 70 Mg Tablet Weekly on Thursdays - Breakfast   Amlodipine 5 MG tablet Daily -Breakfast  Eliquis 5 MG Tablet Twice daily- Breakfast and bedtime  Carvedilol 12.5mg  Tablet Twice daily- Breakfast and bedtime  Losartan 100 mg Tablet - Breakfast  Mometasone (Nasonex) 50 MCG/ACT Nasal spray - 2 sprays daily  Patient declined (meds) last month due to PRN use/additional supply on hand.  Voltaren 1% Gel PRN- (adequate Supply)  Patient is due for next adherence delivery on: 04/28/2020. Called patient and reviewed medications and coordinated delivery.  This delivery to include:  Alendronate (FosaMax) 70 Mg Tablet Weekly on Thursdays - Breakfast   Amlodipine 5 MG tablet Daily -Breakfast  Eliquis 5 MG Tablet Twice daily- Breakfast and bedtime  Carvedilol 12.5mg  Tablet Twice daily- Breakfast and bedtime  Losartan 100  mg Tablet - Breakfast  Mometasone (Nasonex) 50 MCG/ACT Nasal spray - 2 sprays daily   Patient declined the following medications (meds) due to (reason)  Voltaren 1% Gel PRN- (adequate Supply)  Calcium + Vit D (Adequate Supply - OTC)   Emergen-C Immune Plus (Adequate Supply - OTC)   Ocuvite-Luteine Multivitamin (Adequate Supply - OTC) Patient  needs refills for: None ID.  Confirmed delivery date of 04/28/2020, advised patient that pharmacy will contact them the morning of delivery.  [place] Pharmacist Assistant [phone]

## 2020-05-19 ENCOUNTER — Ambulatory Visit: Payer: Medicare Other

## 2020-05-19 ENCOUNTER — Ambulatory Visit: Payer: Self-pay | Admitting: *Deleted

## 2020-05-20 ENCOUNTER — Ambulatory Visit (INDEPENDENT_AMBULATORY_CARE_PROVIDER_SITE_OTHER): Payer: Medicare Other

## 2020-05-20 VITALS — BP 128/70 | HR 65 | Temp 97.1°F | Resp 16 | Ht 65.0 in | Wt 170.4 lb

## 2020-05-20 DIAGNOSIS — Z1211 Encounter for screening for malignant neoplasm of colon: Secondary | ICD-10-CM

## 2020-05-20 DIAGNOSIS — Z0000 Encounter for general adult medical examination without abnormal findings: Secondary | ICD-10-CM | POA: Diagnosis not present

## 2020-05-20 NOTE — Patient Instructions (Addendum)
Shelia Roth , Thank you for taking time to come for your Medicare Wellness Visit. I appreciate your ongoing commitment to your health goals. Please review the following plan we discussed and let me know if I can assist you in the future.   Screening recommendations/referrals: Colonoscopy: Cologuard ordered today. You will be receiving a kit with instructions in the mail. Mammogram: No longer indicated Bone Density: Completed 06/10/2019- Due 06/09/2021 Recommended yearly ophthalmology/optometry visit for glaucoma screening and checkup Recommended yearly dental visit for hygiene and checkup  Vaccinations: Influenza vaccine: Due- You may receive vaccine at our office or at your local pharmacy Pneumococcal vaccine: Completed vaccines Tdap vaccine: Discuss with pharmacy Shingles vaccine: Completed vaccines  Covid-19:Completed vaccines  Advanced directives: Information given today  Conditions/risks identified: See problem list  Next appointment: Follow up in one year for your annual wellness visit 05/24/2021 @ 9:00am   Preventive Care 65 Years and Older, Female Preventive care refers to lifestyle choices and visits with your health care provider that can promote health and wellness. What does preventive care include?  A yearly physical exam. This is also called an annual well check.  Dental exams once or twice a year.  Routine eye exams. Ask your health care provider how often you should have your eyes checked.  Personal lifestyle choices, including:  Daily care of your teeth and gums.  Regular physical activity.  Eating a healthy diet.  Avoiding tobacco and drug use.  Limiting alcohol use.  Practicing safe sex.  Taking low-dose aspirin every day.  Taking vitamin and mineral supplements as recommended by your health care provider. What happens during an annual well check? The services and screenings done by your health care provider during your annual well check will  depend on your age, overall health, lifestyle risk factors, and family history of disease. Counseling  Your health care provider may ask you questions about your:  Alcohol use.  Tobacco use.  Drug use.  Emotional well-being.  Home and relationship well-being.  Sexual activity.  Eating habits.  History of falls.  Memory and ability to understand (cognition).  Work and work environment.  Reproductive health. Screening  You may have the following tests or measurements:  Height, weight, and BMI.  Blood pressure.  Lipid and cholesterol levels. These may be checked every 5 years, or more frequently if you are over 50 years old.  Skin check.  Lung cancer screening. You may have this screening every year starting at age 55 if you have a 30-pack-year history of smoking and currently smoke or have quit within the past 15 years.  Fecal occult blood test (FOBT) of the stool. You may have this test every year starting at age 50.  Flexible sigmoidoscopy or colonoscopy. You may have a sigmoidoscopy every 5 years or a colonoscopy every 10 years starting at age 50.  Hepatitis C blood test.  Hepatitis B blood test.  Sexually transmitted disease (STD) testing.  Diabetes screening. This is done by checking your blood sugar (glucose) after you have not eaten for a while (fasting). You may have this done every 1-3 years.  Bone density scan. This is done to screen for osteoporosis. You may have this done starting at age 65.  Mammogram. This may be done every 1-2 years. Talk to your health care provider about how often you should have regular mammograms. Talk with your health care provider about your test results, treatment options, and if necessary, the need for more tests. Vaccines  Your health   care provider may recommend certain vaccines, such as:  Influenza vaccine. This is recommended every year.  Tetanus, diphtheria, and acellular pertussis (Tdap, Td) vaccine. You may need a  Td booster every 10 years.  Zoster vaccine. You may need this after age 20.  Pneumococcal 13-valent conjugate (PCV13) vaccine. One dose is recommended after age 31.  Pneumococcal polysaccharide (PPSV23) vaccine. One dose is recommended after age 33. Talk to your health care provider about which screenings and vaccines you need and how often you need them. This information is not intended to replace advice given to you by your health care provider. Make sure you discuss any questions you have with your health care provider. Document Released: 09/03/2015 Document Revised: 04/26/2016 Document Reviewed: 06/08/2015 Elsevier Interactive Patient Education  2017 [place] Prevention in the Home Falls can cause injuries. They can happen to people of all ages. There are many things you can do to make your home safe and to help prevent falls. What can I do on the outside of my home?  Regularly fix the edges of walkways and driveways and fix any cracks.  Remove anything that might make you trip as you walk through a door, such as a raised step or threshold.  Trim any bushes or trees on the path to your home.  Use bright outdoor lighting.  Clear any walking paths of anything that might make someone trip, such as rocks or tools.  Regularly check to see if handrails are loose or broken. Make sure that both sides of any steps have handrails.  Any raised decks and porches should have guardrails on the edges.  Have any leaves, snow, or ice cleared regularly.  Use sand or salt on walking paths during winter.  Clean up any spills in your garage right away. This includes oil or grease spills. What can I do in the bathroom?  Use night lights.  Install grab bars by the toilet and in the tub and shower. Do not use towel bars as grab bars.  Use non-skid mats or decals in the tub or shower.  If you need to sit down in the shower, use a plastic, non-slip stool.  Keep the floor dry. Clean  up any water that spills on the floor as soon as it happens.  Remove soap buildup in the tub or shower regularly.  Attach bath mats securely with double-sided non-slip rug tape.  Do not have throw rugs and other things on the floor that can make you trip. What can I do in the bedroom?  Use night lights.  Make sure that you have a light by your bed that is easy to reach.  Do not use any sheets or blankets that are too big for your bed. They should not hang down onto the floor.  Have a firm chair that has side arms. You can use this for support while you get dressed.  Do not have throw rugs and other things on the floor that can make you trip. What can I do in the kitchen?  Clean up any spills right away.  Avoid walking on wet floors.  Keep items that you use a lot in easy-to-reach places.  If you need to reach something above you, use a strong step stool that has a grab bar.  Keep electrical cords out of the way.  Do not use floor polish or wax that makes floors slippery. If you must use wax, use non-skid floor wax.  Do not have  throw rugs and other things on the floor that can make you trip. What can I do with my stairs?  Do not leave any items on the stairs.  Make sure that there are handrails on both sides of the stairs and use them. Fix handrails that are broken or loose. Make sure that handrails are as long as the stairways.  Check any carpeting to make sure that it is firmly attached to the stairs. Fix any carpet that is loose or worn.  Avoid having throw rugs at the top or bottom of the stairs. If you do have throw rugs, attach them to the floor with carpet tape.  Make sure that you have a light switch at the top of the stairs and the bottom of the stairs. If you do not have them, ask someone to add them for you. What else can I do to help prevent falls?  Wear shoes that:  Do not have high heels.  Have rubber bottoms.  Are comfortable and fit you well.  Are  closed at the toe. Do not wear sandals.  If you use a stepladder:  Make sure that it is fully opened. Do not climb a closed stepladder.  Make sure that both sides of the stepladder are locked into place.  Ask someone to hold it for you, if possible.  Clearly mark and make sure that you can see:  Any grab bars or handrails.  First and last steps.  Where the edge of each step is.  Use tools that help you move around (mobility aids) if they are needed. These include:  Canes.  Walkers.  Scooters.  Crutches.  Turn on the lights when you go into a dark area. Replace any light bulbs as soon as they burn out.  Set up your furniture so you have a clear path. Avoid moving your furniture around.  If any of your floors are uneven, fix them.  If there are any pets around you, be aware of where they are.  Review your medicines with your doctor. Some medicines can make you feel dizzy. This can increase your chance of falling. Ask your doctor what other things that you can do to help prevent falls. This information is not intended to replace advice given to you by your health care provider. Make sure you discuss any questions you have with your health care provider. Document Released: 06/03/2009 Document Revised: 01/13/2016 Document Reviewed: 09/11/2014 Elsevier Interactive Patient Education  2017 [employer] 

## 2020-05-20 NOTE — Progress Notes (Signed)
Subjective:   Atalya Dano is a 74 y.o. female who presents for Medicare Annual (Subsequent) preventive examination.  Review of Systems     Cardiac Risk Factors include: advanced age (>92men, >36 women);dyslipidemia;hypertension     Objective:    Today's Vitals   05/20/20 0855  BP: 128/70  Pulse: 65  Resp: 16  Temp: (!) 97.1 F (36.2 C)  TempSrc: Temporal  SpO2: 98%  Weight: 170 lb 6.4 oz (77.3 kg)  Height: 5\' 5"  (1.651 m)   Body mass index is 28.36 kg/m.  Advanced Directives 05/20/2020 05/14/2019  Does Patient Have a Medical Advance Directive? No No  Would patient like information on creating a medical advance directive? Yes (MAU/Ambulatory/Procedural Areas - Information given) No - Patient declined    Current Medications (verified) Outpatient Encounter Medications as of 05/20/2020  Medication Sig  . alendronate (FOSAMAX) 70 MG tablet TAKE ONE TABLET BY MOUTH ONCE A WEEK WITH A full GLASS of water ON an EMPTY stomach  . amLODipine (NORVASC) 5 MG tablet TAKE 1 TABLET BY MOUTH ONCE DAILY  . Calcium Carb-Cholecalciferol (CALCIUM + VITAMIN D3 PO) Take 1 tablet by mouth 2 (two) times daily. Calcium 600mg  and Vitamin d3 34mcg  . carvedilol (COREG) 12.5 MG tablet TAKE 1 TABLET BY MOUTH TWICE DAILY**PLEASE SCHEDULE PHONE VISIT WITH PCP FOR REFILLS**  . diclofenac Sodium (VOLTAREN) 1 % GEL Apply 2 g topically 4 (four) times daily as needed.  Marland Kitchen losartan (COZAAR) 100 MG tablet Take 1 tablet (100 mg total) by mouth daily.  . mometasone (NASONEX) 50 MCG/ACT nasal spray Place 2 sprays into the nose daily.  . Multiple Vitamins-Minerals (EMERGEN-C IMMUNE PLUS) PACK Take 1 Package by mouth daily.  Marland Kitchen apixaban (ELIQUIS) 5 MG TABS tablet Take 1 tablet (5 mg total) by mouth 2 (two) times daily.  . multivitamin-lutein (OCUVITE-LUTEIN) CAPS capsule Take 1 capsule by mouth daily.  (Patient not taking: Reported on 05/20/2020)   No facility-administered encounter medications on file as of  05/20/2020.    Allergies (verified) Codeine   History: Past Medical History:  Diagnosis Date  . Asthma   . Atrial fibrillation ([place])   . Chicken pox   . Gallstones   . Hyperlipidemia   . Hypertension   . Thyroid disease   . UTI (urinary tract infection)    Past Surgical History:  Procedure Laterality Date  . ABDOMINAL HYSTERECTOMY    . APPENDECTOMY  1984  . BREAST SURGERY    . CHOLECYSTECTOMY  1980  . COLONOSCOPY  2011   [facility]  . ESOPHAGOGASTRODUODENOSCOPY     right after gallbladder removal   . MASTECTOMY Bilateral    for mastitis. Says to do blood pressure on left   . PARTIAL HIP ARTHROPLASTY  2017   replacement of head per patient   . THYROIDECTOMY  1991   Family History  Problem Relation Age of Onset  . COPD Father   . Heart disease Father   . Hypertension Father   . Stomach cancer Father        said that they autospy didn't state but dr thinks he did have this. Died with internal bleeding and cardiac arrest   . Diabetes Brother   . Hyperlipidemia Brother   . Heart disease Daughter   . Stroke Maternal Grandmother   . Heart attack Maternal Grandfather   . Heart attack Paternal Grandmother   . Early death Paternal Grandfather   . Colon cancer Neg Hx   . Esophageal cancer  Neg Hx    Social History   Socioeconomic History  . Marital status: Divorced    Spouse name: Not on file  . Number of children: 1  . Years of education: Not on file  . Highest education level: Not on file  Occupational History  . Occupation: retired  Tobacco Use  . Smoking status: Never Smoker  . Smokeless tobacco: Never Used  Vaping Use  . Vaping Use: Never used  Substance and Sexual Activity  . Alcohol use: No  . Drug use: Never  . Sexual activity: Never  Other Topics Concern  . Not on file  Social History Narrative  . Not on file   Social Determinants of Health   Financial Resource Strain: Low Risk   . Difficulty of Paying Living Expenses: Not hard at all    Food Insecurity: No Food Insecurity  . Worried About Charity fundraiser in the Last Year: Never true  . Ran Out of Food in the Last Year: Never true  Transportation Needs: No Transportation Needs  . Lack of Transportation (Medical): No  . Lack of Transportation (Non-Medical): No  Physical Activity:   . Days of Exercise per Week: Not on file  . Minutes of Exercise per Session: Not on file  Stress: No Stress Concern Present  . Feeling of Stress : Not at all  Social Connections: Moderately Integrated  . Frequency of Communication with Friends and Family: More than three times a week  . Frequency of Social Gatherings with Friends and Family: More than three times a week  . Attends Religious Services: More than 4 times per year  . Active Member of Clubs or Organizations: Yes  . Attends Archivist Meetings: More than 4 times per year  . Marital Status: Divorced    Tobacco Counseling Counseling given: Not Answered   Clinical Intake:  Pre-visit preparation completed: Yes  Pain : No/denies pain     Nutritional Status: BMI 25 -29 Overweight Nutritional Risks: None Diabetes: No  How often do you need to have someone help you when you read instructions, pamphlets, or other written materials from your doctor or pharmacy?: 1 - Never What is the last grade level you completed in school?: 12th grade  Diabetic?No  Interpreter Needed?: No  Information entered by :: Caroleen Hamman LPN   Activities of Daily Living In your present state of health, do you have any difficulty performing the following activities: 05/20/2020  Hearing? N  Vision? N  Difficulty concentrating or making decisions? N  Walking or climbing stairs? N  Dressing or bathing? N  Doing errands, shopping? N  Preparing Food and eating ? N  Using the Toilet? N  In the past six months, have you accidently leaked urine? N  Do you have problems with loss of bowel control? N  Managing your Medications? N   Managing your Finances? N  Housekeeping or managing your Housekeeping? N  Some recent data might be hidden    Patient Care Team: Libby Maw, MD as PCP - General (Family Medicine) Germaine Pomfret, [facility] as Pharmacist (Pharmacist)  Indicate any recent Medical Services you may have received from other than [facility] providers in the past year (date may be approximate).     Assessment:   This is a routine wellness examination for [place].  Hearing/Vision screen  Hearing Screening   125Hz  250Hz  500Hz  1000Hz  2000Hz  3000Hz  4000Hz  6000Hz  8000Hz   Right ear:  Left ear:           Comments: No issues  Vision Screening Comments: Cataracts-surgery scheduled Last eye exam-01/2020 [facility]  Dietary issues and exercise activities discussed: Current Exercise Habits: Home exercise routine, Type of exercise: walking, Time (Minutes): 30, Frequency (Times/Week): 7, Weekly Exercise (Minutes/Week): 210  Goals    . Chronic Care Management     CARE PLAN ENTRY  Current Barriers:  . Chronic Disease Management support, education, and care coordination needs related to Hypertension and Osteoporosis   Hypertension . Pharmacist Clinical Goal(s): o Over the next 180 days, patient will work with PharmD and providers to maintain BP goal <140/90 . Current regimen:  o Amlodipine 5 mg  o Carvedilol 12.5 mg  o Losartan 100 mg  . Patient self care activities - Over the next 180 days, patient will: o Check blood pressure weekly, document, and provide at future appointments o Ensure daily salt intake < 2300 mg/day o Slowly increase activity level, aiming for a goal of 150 minutes of moderate intensity exercise weekly  Osteoporosis . Pharmacist Clinical Goal(s) o Over the next 180 days, patient will work with PharmD and providers to prevent fractures . Current regimen:  o Alendronate 70 mg  o Calcium 600 mg + Vitamin D  . Interventions: o Increase calcium to twice  daily o Counseled on oral bisphosphonate administration: take in the morning, 30 minutes prior to food with 6-8 oz of water. Do not lie down for at least 30 minutes after taking. . Patient self care activities - Over the next 180 days, patient will: o Maintain [phone] units of vitamin D daily o Achieve 1200 mg of calcium daily from dietary and supplemental sources  Medication management . Pharmacist Clinical Goal(s): o Over the next 180 days, patient will work with PharmD and providers to maintain optimal medication adherence . Current pharmacy: [facility] . Interventions o Comprehensive medication review performed. o Utilize [facility] for medication synchronization, packaging and delivery. Verbal consent obtained for [facility] enhanced pharmacy services (medication synchronization, adherence packaging, delivery coordination). A medication sync plan was created to allow patient to get all medications delivered once every 30 to 90 days per patient preference. Patient understands they have freedom to choose pharmacy and clinical pharmacist will coordinate care between all prescribers and [facility].  . Patient self care activities - Over the next 180 days, patient will: o Focus on medication adherence by utilizing [facility]  o Take medications as prescribed o Report any questions or concerns to PharmD and/or provider(s)     . Patient Stated     Maintain healthy lifestyle       Depression Screen PHQ 2/9 Scores 05/20/2020 05/14/2019  PHQ - 2 Score 1 0    Fall Risk Fall Risk  05/20/2020 05/14/2019  Falls in the past year? 0 0  Number falls in past yr: 0 -  Injury with Fall? 0 -  Follow up Falls prevention discussed -    Any stairs in or around the home? No  Home free of loose throw rugs in walkways, pet beds, electrical cords, etc? Yes  Adequate lighting in your home to reduce risk of falls? Yes   ASSISTIVE DEVICES UTILIZED TO PREVENT FALLS:  Life alert?  No  Use of a cane, walker or w/c? No  Grab bars in the bathroom? Yes  Shower chair or bench in shower? No  Elevated toilet seat or a handicapped toilet? No   TIMED UP AND GO:  Was the test performed? Yes .  Length of time to ambulate 10 feet: 10 sec.   Gait steady and fast without use of assistive device  Cognitive Function:No cognitive impairment noted.        Immunizations Immunization History  Administered Date(s) Administered  . Influenza, High Dose Seasonal PF 08/25/2018  . Influenza-Unspecified 05/21/2017, 08/21/2018, 06/01/2019  . PFIZER SARS-COV-2 Vaccination 10/17/2019, 11/12/2019  . Pneumococcal Conjugate-13 12/13/2014  . Pneumococcal Polysaccharide-23 10/29/2012  . Zoster Recombinat (Shingrix) 06/01/2019    TDAP status: Due, Education has been provided regarding the importance of this vaccine. Advised may receive this vaccine at local pharmacy or Health Dept. Aware to provide a copy of the vaccination record if obtained from local pharmacy or Health Dept. Verbalized acceptance and understanding.   Flu Vaccine status: Declined, Education has been provided regarding the importance of this vaccine but patient still declined. Advised may receive this vaccine at local pharmacy or Health Dept. Aware to provide a copy of the vaccination record if obtained from local pharmacy or Health Dept. Verbalized acceptance and understanding.  Patient plans to get vaccine after she gets her Covid Booster  Pneumococcal vaccine status: Up to date   Covid-19 vaccine status: Completed vaccines  Qualifies for Shingles Vaccine? No   Zostavax completed No   Shingrix Completed?: Yes  Screening Tests Health Maintenance  Topic Date Due  . Hepatitis C Screening  Never done  . COLONOSCOPY  02/17/2020  . INFLUENZA VACCINE  03/21/2020  . TETANUS/TDAP  06/18/2020 (Originally 03/14/1965)  . DEXA SCAN  Completed  . COVID-19 Vaccine  Completed  . PNA vac Low Risk Adult  Completed  . MAMMOGRAM   Discontinued    Health Maintenance  Health Maintenance Due  Topic Date Due  . Hepatitis C Screening  Never done  . COLONOSCOPY  02/17/2020  . INFLUENZA VACCINE  03/21/2020    Colorectal Cancer Screening: Cologuard ordered today.  Mammogram status: No longer required.  Bilateral Mastectomy  Bone Density status: Completed 06/10/2019. Results reflect: Bone density results: OSTEOPOROSIS. Repeat every 2 years.  Lung Cancer Screening: (Low Dose CT Chest recommended if Age 27-80 years, 30 pack-year currently smoking OR have quit w/in 15years.) does not qualify.    Additional Screening:  Hepatitis C Screening: does qualify Discuss with PCP  Vision Screening: Recommended annual ophthalmology exams for early detection of glaucoma and other disorders of the eye. Is the patient up to date with their annual eye exam?  Yes  Who is the provider or what is the name of the office in which the patient attends annual eye exams? [place] Screening: Recommended annual dental exams for proper oral hygiene  Community Resource Referral / Chronic Care Management: CRR required this visit?  No   CCM required this visit?  No      Plan:     I have personally reviewed and noted the following in the patient's chart:   . Medical and social history . Use of alcohol, tobacco or illicit drugs  . Current medications and supplements . Functional ability and status . Nutritional status . Physical activity . Advanced directives . List of other physicians . Hospitalizations, surgeries, and ER visits in previous 12 months . Vitals . Screenings to include cognitive, depression, and falls . Referrals and appointments  In addition, I have reviewed and discussed with patient certain preventive protocols, quality metrics, and best practice recommendations. A written personalized care plan for preventive services as well as general preventive health  recommendations were provided to  patient.    Marta Antu, LPN   8/89/1694  Nurse Health Advisor  Nurse Notes: None

## 2020-05-21 ENCOUNTER — Telehealth: Payer: Self-pay

## 2020-05-21 DIAGNOSIS — I10 Essential (primary) hypertension: Secondary | ICD-10-CM

## 2020-05-21 DIAGNOSIS — M8000XS Age-related osteoporosis with current pathological fracture, unspecified site, sequela: Secondary | ICD-10-CM

## 2020-05-21 NOTE — Progress Notes (Signed)
Chronic Care Management Pharmacy Assistant   Name: Shelia Roth  MRN: [id number] DOB: 1946/02/02  Reason for Encounter: Medication Review    PCP : Libby Maw, MD  Allergies:   Allergies  Allergen Reactions  . Codeine Shortness Of Breath    Medications: Outpatient Encounter Medications as of 05/21/2020  Medication Sig  . alendronate (FOSAMAX) 70 MG tablet TAKE ONE TABLET BY MOUTH ONCE A WEEK WITH A full GLASS of water ON an EMPTY stomach  . amLODipine (NORVASC) 5 MG tablet TAKE 1 TABLET BY MOUTH ONCE DAILY  . apixaban (ELIQUIS) 5 MG TABS tablet Take 1 tablet (5 mg total) by mouth 2 (two) times daily.  . Calcium Carb-Cholecalciferol (CALCIUM + VITAMIN D3 PO) Take 1 tablet by mouth 2 (two) times daily. Calcium 600mg  and Vitamin d3 3mcg  . carvedilol (COREG) 12.5 MG tablet TAKE 1 TABLET BY MOUTH TWICE DAILY**PLEASE SCHEDULE PHONE VISIT WITH PCP FOR REFILLS**  . diclofenac Sodium (VOLTAREN) 1 % GEL Apply 2 g topically 4 (four) times daily as needed.  Marland Kitchen losartan (COZAAR) 100 MG tablet Take 1 tablet (100 mg total) by mouth daily.  . mometasone (NASONEX) 50 MCG/ACT nasal spray Place 2 sprays into the nose daily.  . Multiple Vitamins-Minerals (EMERGEN-C IMMUNE PLUS) PACK Take 1 Package by mouth daily.  . multivitamin-lutein (OCUVITE-LUTEIN) CAPS capsule Take 1 capsule by mouth daily.  (Patient not taking: Reported on 05/20/2020)   No facility-administered encounter medications on file as of 05/21/2020.    Current Diagnosis: Patient Active Problem List   Diagnosis Date Noted  . Iron deficiency 04/14/2020  . Elevated LDL cholesterol level 01/13/2020  . Anemia 01/13/2020  . Post-nasal drip 10/15/2019  . Screen for colon cancer 09/03/2018  . Age-related osteoporosis with current pathological fracture 10/01/2017  . Vitamin D deficiency 10/01/2017  . PAF (paroxysmal atrial fibrillation) ([place]) 10/01/2017  . Essential hypertension 10/01/2017  . LV dysfunction 10/01/2017      Goals Addressed   None     Follow-Up:  Pharmacist Review   Reviewed chart for medication changes ahead of medication coordination call.  No OVs, Consults, or hospital visits since last care coordination call/Pharmacist visit. (If appropriate, list visit date, provider name)  No medication changes indicated OR if recent visit, treatment plan here.  BP Readings from Last 3 Encounters:  05/20/20 128/70  04/14/20 114/68  01/13/20 114/72    No results found for: HGBA1C   Patient obtains medications through Adherence Packaging  30 Days   Last adherence delivery included: (medication name and frequency)   Alendronate (FosaMax) 70 Mg Tablet [facility] Thursdays- Breakfast              Amlodipine 5 MG tablet Daily -Breakfast             Eliquis 5 MG Tablet Twice daily- Breakfast and bedtime             Carvedilol 12.5mg  Tablet Twice daily- Breakfast and bedtime             Losartan 100 mg Tablet - Breakfast             Mometasone (Nasonex) 50 MCG/ACT Nasal spray -2 spraysdaily  Patient declined (meds) last month due to PRN use/additional supply on hand.  Voltaren 1% Gel PRN- (adequate Supply)             Calcium + Vit D (Adequate Supply - OTC)              Emergen-C  Immune Plus (Adequate Supply - OTC)              Ocuvite-Luteine Multivitamin (Adequate Supply - OTC)  Patient is due for next adherence delivery on: 05/28/2020. Called patient and reviewed medications and coordinated delivery.  This delivery to include:  Alendronate (FosaMax) 70 Mg Tablet [facility] Thursdays- Breakfast              Amlodipine 5 MG tablet Daily -Breakfast             Eliquis 5 MG Tablet Twice daily- Breakfast and bedtime             Carvedilol 12.5mg  Tablet Twice daily- Breakfast and bedtime             Losartan 100 mg Tablet - Breakfast             Mometasone (Nasonex) 50 MCG/ACT Nasal spray -2 spraysdaily   Patient declined the following medications (meds) due to (reason)  Voltaren 1%  Gel PRN- (adequate Supply)             Calcium + Vit D (Adequate Supply - OTC)              Emergen-C Immune Plus (Adequate Supply - OTC)              Ocuvite-Luteine Multivitamin (Adequate Supply - OTC)  Patient needs refills for None ID .  Confirmed delivery date of 05/28/2020, advised patient that pharmacy will contact them the morning of delivery.  [place] Pharmacist Assistant [phone]

## 2020-05-24 DIAGNOSIS — I447 Left bundle-branch block, unspecified: Secondary | ICD-10-CM | POA: Diagnosis not present

## 2020-05-24 DIAGNOSIS — Z7901 Long term (current) use of anticoagulants: Secondary | ICD-10-CM | POA: Diagnosis not present

## 2020-05-24 DIAGNOSIS — I480 Paroxysmal atrial fibrillation: Secondary | ICD-10-CM | POA: Diagnosis not present

## 2020-05-24 DIAGNOSIS — I10 Essential (primary) hypertension: Secondary | ICD-10-CM | POA: Diagnosis not present

## 2020-06-21 ENCOUNTER — Ambulatory Visit: Payer: Medicare Other

## 2020-06-21 DIAGNOSIS — M8000XS Age-related osteoporosis with current pathological fracture, unspecified site, sequela: Secondary | ICD-10-CM

## 2020-06-21 DIAGNOSIS — I10 Essential (primary) hypertension: Secondary | ICD-10-CM

## 2020-06-21 NOTE — Chronic Care Management (AMB) (Signed)
Chronic Care Management Pharmacy  Name: Shelia Roth  MRN: [id number] DOB: 09/11/1945  Chief Complaint/ HPI  Shelia Roth,  74 y.o. , female presents for their Follow-Up CCM visit with the clinical pharmacist via telephone.  PCP : Libby Maw, MD  Their chronic conditions include: Hypertension, Atrial Fibrillation, and Osteoporosis  Office Visits: 05/20/20: Patient presented to Caroleen Hamman, LPN for AWV.  10/27/63: Patient presented to Dr. Ethelene Hal for follow-up.  10/15/19: Patient presented to Dr. Ethelene Hal for Osteoporosis follow-up. Home BP 120s/60s. Patient started on Nasonex for postnasal drip.    Consult Visit: 05/24/20: Patient presented to Dr. Quillian Quince (Cardiology) for A-fib follow-up. Flecainide 50 mg twice daily started.    Medications: Outpatient Encounter Medications as of 06/21/2020  Medication Sig  . alendronate (FOSAMAX) 70 MG tablet TAKE ONE TABLET BY MOUTH ONCE A WEEK WITH A full GLASS of water ON an EMPTY stomach  . amLODipine (NORVASC) 5 MG tablet TAKE 1 TABLET BY MOUTH ONCE DAILY  . apixaban (ELIQUIS) 5 MG TABS tablet Take 1 tablet (5 mg total) by mouth 2 (two) times daily.  . Calcium Carb-Cholecalciferol (CALCIUM + VITAMIN D3 PO) Take 1 tablet by mouth 2 (two) times daily. Calcium 600mg  and Vitamin d3 49mcg  . carvedilol (COREG) 12.5 MG tablet TAKE 1 TABLET BY MOUTH TWICE DAILY**PLEASE SCHEDULE PHONE VISIT WITH PCP FOR REFILLS**  . diclofenac Sodium (VOLTAREN) 1 % GEL Apply 2 g topically 4 (four) times daily as needed.  Marland Kitchen losartan (COZAAR) 100 MG tablet Take 1 tablet (100 mg total) by mouth daily.  . mometasone (NASONEX) 50 MCG/ACT nasal spray Place 2 sprays into the nose daily.  . Multiple Vitamins-Minerals (EMERGEN-C IMMUNE PLUS) PACK Take 1 Package by mouth daily.  . multivitamin-lutein (OCUVITE-LUTEIN) CAPS capsule Take 1 capsule by mouth daily.  (Patient not taking: Reported on 05/20/2020)   No facility-administered encounter medications on file as  of 06/21/2020.   Current Diagnosis/Assessment:  SDOH Interventions     Most Recent Value  SDOH Interventions  Financial Strain Interventions Intervention Not Indicated  Transportation Interventions Intervention Not Indicated     Goals Addressed            This Visit's Progress   . Chronic Care Management       CARE PLAN ENTRY  Current Barriers:  . Chronic Disease Management support, education, and care coordination needs related to Hypertension, Atrial Fibrillation, and Osteoporosis   Hypertension . Pharmacist Clinical Goal(s): o Over the next 180 days, patient will work with PharmD and providers to maintain BP goal <140/90 . Current regimen:  o Amlodipine 5 mg  o Carvedilol 12.5 mg  o Losartan 100 mg  . Patient self care activities - Over the next 180 days, patient will: o Check blood pressure weekly, document, and provide at future appointments o Ensure daily salt intake < 2300 mg/day o Slowly increase activity level, aiming for a goal of 150 minutes of moderate intensity exercise weekly  Osteoporosis . Pharmacist Clinical Goal(s) o Over the next 180 days, patient will work with PharmD and providers to prevent fractures . Current regimen:  o Alendronate 70 mg  o Calcium 600 mg + Vitamin D  . Interventions: o Increase calcium to twice daily o Counseled on oral bisphosphonate administration: take in the morning, 30 minutes prior to food with 6-8 oz of water. Do not lie down for at least 30 minutes after taking. . Patient self care activities - Over the next 180 days, patient will: o Maintain  [phone] units of vitamin D daily o Achieve 1200 mg of calcium daily from dietary and supplemental sources  Medication management . Pharmacist Clinical Goal(s): o Over the next 180 days, patient will work with PharmD and providers to maintain optimal medication adherence . Current pharmacy: [facility] . Interventions o Comprehensive medication review performed. o Utilize [facility] for medication synchronization, packaging and delivery. Verbal consent obtained for [facility] enhanced pharmacy services (medication synchronization, adherence packaging, delivery coordination). A medication sync plan was created to allow patient to get all medications delivered once every 30 to 90 days per patient preference. Patient understands they have freedom to choose pharmacy and clinical pharmacist will coordinate care between all prescribers and [facility].  . Patient self care activities - Over the next 180 days, patient will: o Focus on medication adherence by utilizing [facility]  o Take medications as prescribed o Report any questions or concerns to PharmD and/or provider(s)       Paroxysmal AFIB   Patient is currently rate and rhythm controlled.  Patient has failed these meds in past: Eliquis (no longer needed) Patient is currently controlled on the following medications:   Eliquis 5 mg twice daily   Carvedilol 12.5 mg BID   Flecainide 50 mg twice daily   We discussed:  Tolerating flecainide well. Denies any symptoms of being out of rhythm.   Plan  Continue current medications  Hypertension   BP goal is: <130/80  Office blood pressures are  BP Readings from Last 3 Encounters:  05/20/20 128/70  04/14/20 114/68  01/13/20 114/72   CMP Latest Ref Rng & Units 01/13/2020 09/03/2018 10/01/2017  Glucose 70 - 99 mg/dL 111(H) 93 88  BUN 6 - 23 mg/dL 19 22 27(H)  Creatinine 0.40 - 1.20 mg/dL 0.89 0.79 0.78  Sodium 135 - 145 mEq/L 141 139 142  Potassium 3.5 - 5.1 mEq/L 4.1 4.2 4.0  Chloride 96 - 112 mEq/L 105 104 105  CO2 19 - 32 mEq/L 29 27 27   Calcium 8.4 - 10.5 mg/dL 9.2 9.0 9.1  Total Protein 6.0 - 8.3 g/dL 6.6 6.7 6.8  Total Bilirubin 0.2 - 1.2 mg/dL 0.7 0.6 0.6  Alkaline Phos 39 - 117 U/L 62 49 55  AST 0 - 37 U/L 13 10 12   ALT 0 - 35 U/L 10 9 10    Patient has failed these meds in the past: n/a Patient is currently controlled on  the following medications:   Amlodipine 5 mg daily  Carvedilol 12.5 mg BID   Losartan 100 mg daily   Patient checks BP at home 1-2x per week. Uses a wrist cuff.   Patient home BP readings are ranging: 120s/60s   We discussed diet and exercise extensively.  Plan  Continue current medications   Osteoporosis   Last DEXA Scan: 06/10/19   T-Score femoral neck: -2.8  T-Score total hip: -2.8  T-Score lumbar spine: -1.2   VITD  Date Value Ref Range Status  04/14/2020 59.00 30.00 - 100.00 ng/mL Final    Patient is a candidate for pharmacologic treatment due to history of hip fracture   Patient has failed these meds in past: n/a Patient is currently controlled on the following medications:   Alendronate 70 mg weekly (thursdays) Started Jan 2019   Calcium + Vit D 600 mg-25 mcg (AM) We discussed:  Recommend [phone] units of vitamin D daily. Recommend 1200 mg of calcium daily from dietary and supplemental sources. Counseled on oral bisphosphonate administration: take in  the morning, 30 minutes prior to food with 6-8 oz of water. Do not lie down for at least 30 minutes after taking.  Plan  Continue Current Medications  Allergies   Patient has failed these meds in past: n/a Patient is currently controlled on the following medications:   Nasonex 88mcg/act 2 spray daily PRN  We discussed:  Reports significant improvement. Drainage, dry cough improved.   Plan  Continue current medications   Misc/ OTC   Dicolfenac 1% gel (uses once weekly) Emergen-C daily  Ocuvite daily   Plan  Continue current medications  Vaccines   Reviewed and discussed patient's vaccination history.    Immunization History  Administered Date(s) Administered  . Influenza, High Dose Seasonal PF 08/25/2018  . Influenza-Unspecified 05/21/2017, 08/21/2018, 06/01/2019  . PFIZER SARS-COV-2 Vaccination 10/17/2019, 11/12/2019  . Pneumococcal Conjugate-13 12/13/2014  . Pneumococcal Polysaccharide-23  10/29/2012  . Zoster Recombinat (Shingrix) 06/01/2019   Patient received Pfizer booster shot on 05/24/20.   Medication Management   Pt uses [facility] for all medications.   BP Readings from Last 3 Encounters:  05/20/20 128/70  04/14/20 114/68  01/13/20 114/72    No results found for: HGBA1C   Patient obtains medications through Adherence Packaging  30 Days   Last adherence delivery included: (medication name and frequency)  Alendronate (FosaMax) 70 Mg Tablet [facility] Thursdays- Breakfast  Amlodipine 5 MG tablet Daily -Breakfast Eliquis 5 MG Tablet Twice daily- Breakfast and bedtime Carvedilol 12.5mg  Tablet Twice daily- Breakfast and bedtime Losartan 100 mg Tablet - Breakfast Mometasone (Nasonex) 50 MCG/ACT Nasal spray -2 spraysdaily  Patient is due for next adherence delivery on: 06/28/20. Called patient and reviewed medications and coordinated delivery.  This delivery to include:  Alendronate (FosaMax) 70 Mg Tablet Weekly on Thursdays - Before Breakfast  Amlodipine 5 MG tablet Daily -Breakfast  Eliquis 5 MG Tablet Twice daily- Breakfast and bedtime  Carvedilol 12.5mg  Tablet Twice daily- Breakfast and bedtime  Losartan 100 mg Tablet - Breakfast  Mometasone (Nasonex) 50 MCG/ACT Nasal spray - 2 sprays daily  Flecainide 50 mg tablet twice daily - Breakfast and bedtime  Patient declined the following medications (meds) due to (reason)  Voltaren 1% Gel PRN- (adequate Supply)  Calcium + Vit D (Adequate Supply - OTC)  Emergen-C Immune Plus (Adequate Supply - OTC)        Ocuvite-Luteine Multivitamin (Adequate Supply - OTC)  Patient needs refills for Losartan 100 mg.  Confirmed delivery date of 06/28/20, advised patient that pharmacy will contact them the morning of delivery.  Follow up: 12 month phone visit  [place] [facility]  [phone]

## 2020-06-22 ENCOUNTER — Other Ambulatory Visit: Payer: Self-pay | Admitting: Family Medicine

## 2020-07-05 ENCOUNTER — Telehealth: Payer: Medicare Other

## 2020-07-08 DIAGNOSIS — Z1212 Encounter for screening for malignant neoplasm of rectum: Secondary | ICD-10-CM | POA: Diagnosis not present

## 2020-07-08 DIAGNOSIS — Z1211 Encounter for screening for malignant neoplasm of colon: Secondary | ICD-10-CM | POA: Diagnosis not present

## 2020-07-09 LAB — COLOGUARD: Cologuard: POSITIVE — AB

## 2020-07-12 ENCOUNTER — Other Ambulatory Visit: Payer: Self-pay | Admitting: Family Medicine

## 2020-07-12 DIAGNOSIS — I480 Paroxysmal atrial fibrillation: Secondary | ICD-10-CM

## 2020-07-20 ENCOUNTER — Telehealth: Payer: Self-pay

## 2020-07-20 NOTE — Progress Notes (Signed)
Chronic Care Management Pharmacy Assistant   Name: Shelia Roth  MRN: [id number] DOB: [birth year]  Reason for Encounter: Medication Review  PCP : Libby Maw, MD  Allergies:   Allergies  Allergen Reactions  . Codeine Shortness Of Breath    Medications: Outpatient Encounter Medications as of 07/20/2020  Medication Sig  . alendronate (FOSAMAX) 70 MG tablet TAKE ONE TABLET BY MOUTH ONCE A WEEK WITH A full GLASS of water ON an EMPTY stomach  . amLODipine (NORVASC) 5 MG tablet TAKE 1 TABLET BY MOUTH ONCE DAILY  . Calcium Carb-Cholecalciferol (CALCIUM + VITAMIN D3 PO) Take 1 tablet by mouth 2 (two) times daily. Calcium 600mg  and Vitamin d3 69mcg  . carvedilol (COREG) 12.5 MG tablet TAKE 1 TABLET BY MOUTH TWICE DAILY**PLEASE SCHEDULE PHONE VISIT WITH PCP FOR REFILLS**  . diclofenac Sodium (VOLTAREN) 1 % GEL Apply 2 g topically 4 (four) times daily as needed.  Marland Kitchen ELIQUIS 5 MG TABS tablet TAKE ONE TABLET BY MOUTH EVERY MORNING and TAKE ONE TABLET BY MOUTH EVERYDAY AT BEDTIME  . losartan (COZAAR) 100 MG tablet TAKE ONE TABLET BY MOUTH EVERY MORNING  . mometasone (NASONEX) 50 MCG/ACT nasal spray Place 2 sprays into the nose daily.  . Multiple Vitamins-Minerals (EMERGEN-C IMMUNE PLUS) PACK Take 1 Package by mouth daily.  . multivitamin-lutein (OCUVITE-LUTEIN) CAPS capsule Take 1 capsule by mouth daily.  (Patient not taking: Reported on 05/20/2020)   No facility-administered encounter medications on file as of 07/20/2020.    Current Diagnosis: Patient Active Problem List   Diagnosis Date Noted  . Iron deficiency 04/14/2020  . Elevated LDL cholesterol level 01/13/2020  . Anemia 01/13/2020  . Post-nasal drip 10/15/2019  . Screen for colon cancer 09/03/2018  . Age-related osteoporosis with current pathological fracture 10/01/2017  . Vitamin D deficiency 10/01/2017  . PAF (paroxysmal atrial fibrillation) ([place]) 10/01/2017  . Essential hypertension 10/01/2017  . LV dysfunction  10/01/2017    Follow-Up:  Pharmacist Review   Reviewed chart for medication changes ahead of medication coordination call.  No OVs, Consults, or hospital visits since last care coordination call/Pharmacist visit.  No medication changes indicated   BP Readings from Last 3 Encounters:  05/20/20 128/70  04/14/20 114/68  01/13/20 114/72    No results found for: HGBA1C   Patient obtains medications through Adherence Packaging  30 Days   Last adherence delivery included: (medication name and frequency)   Alendronate (FosaMax) 70 Mg Tablet Weekly on Thursdays - Before Breakfast             Amlodipine 5 MG tablet Daily -Breakfast             Eliquis 5 MG Tablet Twice daily- Breakfast and bedtime             Carvedilol 12.5mg  Tablet Twice daily- Breakfast and bedtime             Losartan 100 mg Tablet - Breakfast             Mometasone (Nasonex) 50 MCG/ACT Nasal spray - 2 sprays daily             Flecainide 50 mg tablet twice daily - Breakfast and bedtime  Patient declined (meds) last month due to PRN use/additional supply on hand.  Voltaren 1% Gel PRN- (adequate Supply)       Calcium + Vit D (Adequate Supply - OTC)       Emergen-C Immune Plus (Adequate Supply - OTC)  Ocuvite-Luteine Multivitamin (Adequate Supply - OTC)  Patient is due for next adherence delivery on: 07/27/2020. Called patient and reviewed medications and coordinated delivery.  This delivery to include:   Alendronate (FosaMax) 70 Mg Tablet Weekly on Thursdays - Before Breakfast             Amlodipine 5 MG tablet Daily -Breakfast             Eliquis 5 MG Tablet Twice daily- Breakfast and bedtime             Carvedilol 12.5mg  Tablet Twice daily- Breakfast and bedtime             Losartan 100 mg Tablet - Breakfast             Mometasone (Nasonex) 50 MCG/ACT Nasal spray - 2 sprays daily             Flecainide 50 mg tablet twice daily - Breakfast and bedtime  Patient declined the following medications (meds)  due to (reason)  Voltaren 1% Gel PRN- (adequate Supply)       Calcium + Vit D (Adequate Supply - OTC)       Emergen-C Immune Plus (Adequate Supply - OTC)        Ocuvite-Luteine Multivitamin (Adequate Supply - OTC)   Patient needs refills for : Amlodipine 5 MG, Carvedilol 12.5mg . Reached out to PCP to request refill on 07/23/2020.  Confirmed delivery date of 07/27/2020, advised patient that pharmacy will contact them the morning of delivery.  Blood pressure readings:  On 07/18/2020 it was 123/70.  On 07/19/2020 it was 105/58.  On 07/21/2020 it was 118/68.  On 07/23/2020 it was 123/67  [place] Pharmacist Assistant [phone]

## 2020-07-23 ENCOUNTER — Encounter: Payer: Self-pay | Admitting: Family Medicine

## 2020-07-23 ENCOUNTER — Telehealth: Payer: Self-pay

## 2020-07-23 ENCOUNTER — Telehealth: Payer: Self-pay | Admitting: Family Medicine

## 2020-07-23 ENCOUNTER — Other Ambulatory Visit: Payer: Self-pay | Admitting: Family Medicine

## 2020-07-23 DIAGNOSIS — I10 Essential (primary) hypertension: Secondary | ICD-10-CM

## 2020-07-23 DIAGNOSIS — I519 Heart disease, unspecified: Secondary | ICD-10-CM

## 2020-07-23 DIAGNOSIS — I480 Paroxysmal atrial fibrillation: Secondary | ICD-10-CM

## 2020-07-23 DIAGNOSIS — R195 Other fecal abnormalities: Secondary | ICD-10-CM

## 2020-07-23 NOTE — Telephone Encounter (Signed)
Last OV 04/14/20 Last fill for Amlodipine 04/14/20 #90/0 Last fill for Carvedilol 03/30/20  #180/0

## 2020-07-23 NOTE — Addendum Note (Signed)
Addended by: Ronnald Nian on: 07/23/2020 11:39 AM   Modules accepted: Orders

## 2020-07-23 NOTE — Telephone Encounter (Signed)
Received the Cologuard results for patient and they were positive.  Recommended to be followed up by a colonoscopy.   Please review and advise.  (Dr Ethelene Hal pt) Thanks.  Dm/cma

## 2020-07-23 NOTE — Telephone Encounter (Signed)
Patient notified VIA phone of results/recommedations. No questions and will wait to hear on referral to GI.  Dm/cma

## 2020-07-23 NOTE — Telephone Encounter (Signed)
Caller Name: Bessie w/[facility] Call back phone #: [phone]  MEDICATION(S): amlodipine & carvedilol - no refills remain on RX  Preferred Pharmacy: [facility]

## 2020-07-23 NOTE — Telephone Encounter (Signed)
Please call pt to let her know cologuard test is positive so colonoscopy is recommended to further evaluate. This does not mean she has colon cancer, but it does warrant colonoscopy to better understand what is going on. Referral placed to [facility] for colonoscopy.

## 2020-07-23 NOTE — Telephone Encounter (Signed)
Shelia Roth with [employer] [facility] called. Abnormal Cologuard was faxed 07/20/2020. I did not see in front folders and asked her to refax. She states this needs critical review. Placed in Dr. Bebe Shaggy folder in the front office.

## 2020-07-26 NOTE — Telephone Encounter (Signed)
Notes show that patient has referral to GI for colonoscopy.

## 2020-08-18 ENCOUNTER — Telehealth: Payer: Self-pay

## 2020-08-18 NOTE — Progress Notes (Addendum)
Chronic Care Management Pharmacy Assistant   Name: Shelia Roth  MRN: [id number] DOB: 02-20-1946  Reason for Encounter: Medication Review   PCP : Mliss Sax, MD  Allergies:   Allergies  Allergen Reactions   Codeine Shortness Of Breath    Medications: Outpatient Encounter Medications as of 08/18/2020  Medication Sig   alendronate (FOSAMAX) 70 MG tablet TAKE ONE TABLET BY MOUTH ONCE A WEEK WITH A full GLASS of water ON an EMPTY stomach   amLODipine (NORVASC) 5 MG tablet TAKE ONE TABLET BY MOUTH EVERY MORNING   Calcium Carb-Cholecalciferol (CALCIUM + VITAMIN D3 PO) Take 1 tablet by mouth 2 (two) times daily. Calcium 600mg  and Vitamin d3   carvedilol (COREG) 12.5 MG tablet TAKE ONE TABLET BY MOUTH AT BREAKFAST AND AT BEDTIME   diclofenac Sodium (VOLTAREN) 1 % GEL Apply 2 g topically 4 (four) times daily as needed.   ELIQUIS 5 MG TABS tablet TAKE ONE TABLET BY MOUTH EVERY MORNING and TAKE ONE TABLET BY MOUTH EVERYDAY AT BEDTIME   losartan (COZAAR) 100 MG tablet TAKE ONE TABLET BY MOUTH EVERY MORNING   mometasone (NASONEX) 50 MCG/ACT nasal spray Place 2 sprays into the nose daily.   Multiple Vitamins-Minerals (EMERGEN-C IMMUNE PLUS) PACK Take 1 Package by mouth daily.   multivitamin-lutein (OCUVITE-LUTEIN) CAPS capsule Take 1 capsule by mouth daily.  (Patient not taking: Reported on 05/20/2020)   No facility-administered encounter medications on file as of 08/18/2020.    Current Diagnosis: Patient Active Problem List   Diagnosis Date Noted   Iron deficiency 04/14/2020   Elevated LDL cholesterol level 01/13/2020   Anemia 01/13/2020   Post-nasal drip 10/15/2019   Screen for colon cancer 09/03/2018   Age-related osteoporosis with current pathological fracture 10/01/2017   Vitamin D deficiency 10/01/2017   PAF (paroxysmal atrial fibrillation) ([facility]) 10/01/2017   Essential hypertension 10/01/2017   LV dysfunction 10/01/2017    Goals Addressed   None     Reviewed chart for medication changes ahead of medication coordination call.  No OVs, Consults, or hospital visits since last care coordination call/Pharmacist visit.  No medication changes indicated  BP Readings from Last 3 Encounters:  05/20/20 128/70  04/14/20 114/68  01/13/20 114/72    No results found for: HGBA1C   Patient obtains medications through Adherence Packaging  30 Days   Last adherence delivery included:     Alendronate (FosaMax) 70 Mg Tablet Weekly on Thursdays - Before Breakfast             Amlodipine 5 MG tablet Daily -Breakfast             Eliquis 5 MG Tablet Twice daily- Breakfast and bedtime             Carvedilol 12.5mg  Tablet Twice daily- Breakfast and bedtime             Losartan 100 mg Tablet - Breakfast             Mometasone (Nasonex) 50 MCG/ACT Nasal spray - 2 sprays daily             Flecainide 50 mg tablet twice daily - Breakfast and bedtime  Patient declined medication last month due to PRN use/additional supply on hand.   Voltaren 1% Gel PRN- (adequate Supply)       Calcium + Vit D (Adequate Supply - OTC)       Emergen-C Immune Plus (Adequate Supply - OTC)  Ocuvite-Luteine Multivitamin (Adequate Supply - OTC)  Patient is due for next adherence delivery on: 08/26/2020. Called patient and reviewed medications and coordinated delivery.  This delivery to include:  Alendronate (FosaMax) 70 Mg Tablet Weekly on Thursdays - Before Breakfast             Amlodipine 5 MG tablet Daily -Breakfast             Eliquis 5 MG Tablet Twice daily- Breakfast and bedtime             Carvedilol 12.5mg  Tablet Twice daily- Breakfast and bedtime             Losartan 100 mg Tablet - Breakfast             Mometasone (Nasonex) 50 MCG/ACT Nasal spray - 2 sprays daily             Flecainide 50 mg tablet twice daily - Breakfast and bedtime  Patient will not need a short fill of medication  prior to adherence delivery. Patient will not need a acute fill  of  medication  prior to adherence delivery.  Patient declined the following medications   Voltaren 1% Gel PRN- (adequate Supply)       Calcium + Vit D (Adequate Supply - OTC)       Emergen-C Immune Plus (Adequate Supply - OTC)         Ocuvite-Luteine Multivitamin (Adequate Supply - OTC)  Patient needs refills for None ID.  Confirmed delivery date of 08/26/2020, advised patient that pharmacy will contact them the morning of delivery.  Blood pressure readings:  On 08/14/2020 it was 104/55.  On 08/15/2020 it was 106/74.  On 08/16/2020 it was 109/67.  On 08/17/2020 it was 109/64.  Follow-Up:  Pharmacist Review   [place] Clinical Pharmacist Assistant [phone]   12 minutes spent in review, coordination, and documentation. Doristine Section Clinical Pharmacist [facility]  [phone]

## 2020-09-14 ENCOUNTER — Ambulatory Visit: Payer: Medicare Other | Admitting: Gastroenterology

## 2020-09-14 ENCOUNTER — Encounter: Payer: Self-pay | Admitting: Gastroenterology

## 2020-09-14 VITALS — BP 112/70 | HR 74 | Ht 65.0 in | Wt 170.0 lb

## 2020-09-14 DIAGNOSIS — Z7901 Long term (current) use of anticoagulants: Secondary | ICD-10-CM | POA: Diagnosis not present

## 2020-09-14 DIAGNOSIS — R195 Other fecal abnormalities: Secondary | ICD-10-CM

## 2020-09-14 DIAGNOSIS — D649 Anemia, unspecified: Secondary | ICD-10-CM | POA: Diagnosis not present

## 2020-09-14 DIAGNOSIS — R1013 Epigastric pain: Secondary | ICD-10-CM

## 2020-09-14 NOTE — Progress Notes (Signed)
P  Chief Complaint:    Positive Cologuard test  HPI:    75 year old female with a history of osteoporosis, hypertension, atrial fibrillation (on Eliquis), vitamin D deficiency, initially seen in the GI clinic 09/2018 for evaluation of ongoing CRC screening, with plan for repeat colonoscopy, but this was canceled due to start of COVID-19 pandemic and never rescheduled by patient.  Subsequent positive Cologuard in 06/2020.  She is otherwise without GI symptoms. Has had dark stools after starting iron supplement in 12/2019. Returns to normal when she holds the PO iron.   Recently with epigastric pain in the setting of the above +ColoGuard and NSAIDs for tooth abscess.  Pain overall improving.  Has had Covid vaccine + booster  Endoscopic history: -Colonoscopy (01/2010, Dr. Cindee Lame at [facility]): Official report unavailable, but limited note suggests no abnormality noted with recommendation repeat in 5 years. -EGD many years ago in [place] n/f gastritis without ulcers per patient  CBC Latest Ref Rng & Units 04/14/2020 01/13/2020 09/03/2018  WBC 4.0 - 10.5 K/uL 5.4 6.0 4.1  Hemoglobin 12.0 - 15.0 g/dL 11.9(L) 12.0 11.8(L)  Hematocrit 36.0 - 46.0 % 36.5 35.4(L) 34.6(L)  Platelets 150.0 - 400.0 K/uL 285.0 298.0 263.0    03/2020: Normal vitamin D, B12, iron panel  Review of systems:     No chest pain, no SOB, no fevers, no urinary sx   Past Medical History:  Diagnosis Date  . Asthma   . Atrial fibrillation ([place])   . Chicken pox   . Gallstones   . Hyperlipidemia   . Hypertension   . Thyroid disease   . UTI (urinary tract infection)     Patient's surgical history, family medical history, social history, medications and allergies were all reviewed in Epic    Current Outpatient Medications  Medication Sig Dispense Refill  . alendronate (FOSAMAX) 70 MG tablet TAKE ONE TABLET BY MOUTH ONCE A WEEK WITH A full GLASS of water ON an EMPTY stomach 4 tablet 5  . amLODipine (NORVASC) 5 MG  tablet TAKE ONE TABLET BY MOUTH EVERY MORNING 90 tablet 5  . Calcium Carb-Cholecalciferol (CALCIUM + VITAMIN D3 PO) Take 1 tablet by mouth 2 (two) times daily. Calcium 600mg  and Vitamin d3 57mcg    . carvedilol (COREG) 12.5 MG tablet TAKE ONE TABLET BY MOUTH AT BREAKFAST AND AT BEDTIME 180 tablet 5  . diclofenac Sodium (VOLTAREN) 1 % GEL Apply 2 g topically 4 (four) times daily as needed.    Marland Kitchen ELIQUIS 5 MG TABS tablet TAKE ONE TABLET BY MOUTH EVERY MORNING and TAKE ONE TABLET BY MOUTH EVERYDAY AT BEDTIME 60 tablet 5  . flecainide (TAMBOCOR) 50 MG tablet Take 50 mg by mouth 2 (two) times daily.    Marland Kitchen losartan (COZAAR) 100 MG tablet TAKE ONE TABLET BY MOUTH EVERY MORNING 90 tablet 3  . mometasone (NASONEX) 50 MCG/ACT nasal spray Place 2 sprays into the nose daily. 17 g 12  . Multiple Vitamins-Minerals (EMERGEN-C IMMUNE PLUS) PACK Take 1 Package by mouth daily.    . multivitamin-lutein (OCUVITE-LUTEIN) CAPS capsule Take 1 capsule by mouth daily.     No current facility-administered medications for this visit.    Physical Exam:     BP 112/70   Pulse 74   Ht 5\' 5"  (1.651 m)   Wt 170 lb (77.1 kg)   LMP  (LMP Unknown)   BMI 28.29 kg/m   GENERAL:  Pleasant female in NAD PSYCH: : Cooperative, normal affect EENT:  conjunctiva pink, mucous membranes moist, neck supple without masses CARDIAC:  RRR, no murmur heard, no peripheral edema PULM: Normal respiratory effort, lungs CTA bilaterally, no wheezing ABDOMEN:  Nondistended, soft, nontender. No obvious masses, no hepatomegaly,  normal bowel sounds SKIN:  turgor, no lesions seen Musculoskeletal:  Normal muscle tone, normal strength NEURO: Alert and oriented x 3, no focal neurologic deficits   IMPRESSION and PLAN:    1) Positive Cologuard test -Schedule colonoscopy  2) Mild normocytic anemia 3) Epigastric pain - Will plan on EGD at time of colonoscopy to r/o UGI pathology -Has stopped NSAIDs -Hold iron x7 days prior to procedures  4)  Dark stools -Suspect related to iron supplement, but evaluating for UGI pathology with EGD as above  5) Chronic anticoagulation 6) Atrial fibrillation -Hold Eliquis 2 days before procedure - will instruct when and how to resume after procedure. Low but real risk of cardiovascular event such as heart attack, stroke, embolism, thrombosis or ischemia/infarct of other organs off Eliquis explained and need to seek urgent help if this occurs. The patient consents to proceed. Will communicate by phone or EMR with patient's prescribing provider to confirm that holding Eliquis is reasonable in this case  The indications, risks, and benefits of EGD and colonoscopy were explained to the patient in detail. Risks include but are not limited to bleeding, perforation, adverse reaction to medications, and cardiopulmonary compromise. Sequelae include but are not limited to the possibility of surgery, hospitalization, and mortality. The patient verbalized understanding and wished to proceed. All questions answered, referred to scheduler and bowel prep ordered. Further recommendations pending results of the exam.            Dominic Pea Aliyanah Rozas ,DO, FACG 09/14/2020, 10:02 AM

## 2020-09-14 NOTE — Patient Instructions (Signed)
If you are age 74 or older, your body mass index should be between 23-30. Your Body mass index is 28.29 kg/m. If this is out of the aforementioned range listed, please consider follow up with your Primary Care Provider.  If you are age 35 or younger, your body mass index should be between 19-25. Your Body mass index is 28.29 kg/m. If this is out of the aformentioned range listed, please consider follow up with your Primary Care Provider.   You have been scheduled for an endoscopy and colonoscopy. Please follow the written instructions given to you at your visit today. Please pick up your prep supplies at the pharmacy within the next 1-3 days. If you use inhalers (even only as needed), please bring them with you on the day of your procedure.  You will be contacted by our office prior to your procedure for directions on holding your Eliquis.  If you do not hear from our office 1 week prior to your scheduled procedure, please call [phone] to discuss.   It was a pleasure to see you today!  Vito Cirigliano, D.O.

## 2020-09-16 ENCOUNTER — Telehealth: Payer: Self-pay

## 2020-09-16 NOTE — Progress Notes (Signed)
Chronic Care Management Pharmacy Assistant   Name: Shelia Roth  MRN: [id number] DOB: 02-16-1946  Reason for Encounter: Medication Review  Patient Questions:  1.  Have you seen any other providers since your last visit? No  2.  Any changes in your medicines or health? No    PCP : Libby Maw, MD  Allergies:   Allergies  Allergen Reactions  . Codeine Shortness Of Breath    Medications: Outpatient Encounter Medications as of 09/16/2020  Medication Sig Note  . alendronate (FOSAMAX) 70 MG tablet TAKE ONE TABLET BY MOUTH ONCE A WEEK WITH A full GLASS of water ON an EMPTY stomach   . amLODipine (NORVASC) 5 MG tablet TAKE ONE TABLET BY MOUTH EVERY MORNING   . Calcium Carb-Cholecalciferol (CALCIUM + VITAMIN D3 PO) Take 1 tablet by mouth 2 (two) times daily. Calcium 600mg  and Vitamin d3 80mcg   . carvedilol (COREG) 12.5 MG tablet TAKE ONE TABLET BY MOUTH AT BREAKFAST AND AT BEDTIME   . diclofenac Sodium (VOLTAREN) 1 % GEL Apply 2 g topically 4 (four) times daily as needed.   Marland Kitchen ELIQUIS 5 MG TABS tablet TAKE ONE TABLET BY MOUTH EVERY MORNING and TAKE ONE TABLET BY MOUTH EVERYDAY AT BEDTIME   . flecainide (TAMBOCOR) 50 MG tablet Take 50 mg by mouth 2 (two) times daily. 08/19/2020: [facility]  . losartan (COZAAR) 100 MG tablet TAKE ONE TABLET BY MOUTH EVERY MORNING   . mometasone (NASONEX) 50 MCG/ACT nasal spray Place 2 sprays into the nose daily.   . Multiple Vitamins-Minerals (EMERGEN-C IMMUNE PLUS) PACK Take 1 Package by mouth daily.   . multivitamin-lutein (OCUVITE-LUTEIN) CAPS capsule Take 1 capsule by mouth daily.    No facility-administered encounter medications on file as of 09/16/2020.    Current Diagnosis: Patient Active Problem List   Diagnosis Date Noted  . Iron deficiency 04/14/2020  . Elevated LDL cholesterol level 01/13/2020  . Anemia 01/13/2020  . Post-nasal drip 10/15/2019  . Screen for colon cancer 09/03/2018  .  Age-related osteoporosis with current pathological fracture 10/01/2017  . Vitamin D deficiency 10/01/2017  . PAF (paroxysmal atrial fibrillation) ([place]) 10/01/2017  . Essential hypertension 10/01/2017  . LV dysfunction 10/01/2017    Goals Addressed   None    Reviewed chart for medication changes ahead of medication coordination call.  No OVs, Consults, or hospital visits since last care coordination call/Pharmacist visit. No medication changes indicated.  BP Readings from Last 3 Encounters:  09/14/20 112/70  05/20/20 128/70  04/14/20 114/68    No results found for: HGBA1C   Patient obtains medications through Adherence Packaging  30 Days   Last adherence delivery included:      Alendronate (FosaMax) 70 Mg Tablet Weekly on Thursdays -Before Breakfast Amlodipine 5 MG tablet Daily -Breakfast Eliquis 5 MG Tablet Twice daily- Breakfast and bedtime Carvedilol 12.5mg  Tablet Twice daily- Breakfast and bedtime Losartan 100 mg Tablet - Breakfast Mometasone (Nasonex) 50 MCG/ACT Nasal spray - 2 sprays daily Flecainide 50 mg tablet twice daily -Breakfast and bedtime  Patient declined medication last month:   Voltaren 1% Gel PRN- (adequate Supply) Calcium + Vit D (Adequate Supply - OTC) Emergen-C Immune Plus (Adequate Supply - OTC)         Ocuvite-Luteine Multivitamin (Adequate Supply - OTC)  Patient is due for next adherence delivery on: 09/24/2020. Called patient and reviewed medications and coordinated delivery.   This delivery to include:   Alendronate (FosaMax) 70 Mg  Tablet Weekly on Thursdays -Before Breakfast Amlodipine 5 MG tablet Daily -Breakfast Eliquis 5 MG Tablet Twice daily- Breakfast and bedtime Carvedilol 12.5mg  Tablet Twice daily- Breakfast and bedtime Losartan 100 mg Tablet - Breakfast Mometasone (Nasonex) 50  MCG/ACT Nasal spray - 2 sprays daily Flecainide 50 mg tablet twice daily -Breakfast and bedtime  Patient will not  need a short fill of medication, prior to adherence delivery.  Patient will not need a  acute fill of medication, prior to adherence delivery.  Patient declined the following medications:  Voltaren 1% Gel PRN- (adequate Supply) Calcium + Vit D (Adequate Supply - OTC) Emergen-C Immune Plus (Adequate Supply - OTC)         Ocuvite-Luteine Multivitamin (Adequate Supply - OTC)  Patient needs refills for None ID.  Confirmed delivery date of 09/24/2020, advised patient that pharmacy will contact them the morning of delivery.  Blood pressure readings:  On 09/16/2020 it was 112/70.  On 09/15/2020 it was 111/62.  On 09/14/2020 it was 105/62.  On 09/13/2020 it was 119/71.  On 09/12/2020 it was 106/74  Follow-Up:  Pharmacist Review   Bessie Algoma Pharmacist Assistant [phone]

## 2020-09-20 ENCOUNTER — Telehealth: Payer: Self-pay | Admitting: General Surgery

## 2020-09-20 NOTE — Telephone Encounter (Signed)
This patient is not followed by [facility] cardiology, she is followed by Dr Bishop [facility] at [facility].  Please contact his office regarding clearance and anticoagulation.  Kerin Ransom PA-C 09/20/2020 10:47 AM

## 2020-09-20 NOTE — Telephone Encounter (Signed)
[facility] Pre-operative Risk Assessment     Request for surgical clearance:     Endoscopy Procedure  What type of surgery is being performed?     Colonoscopy/EGD  When is this surgery scheduled?     10/05/2020  What type of clearance is required ?   Pharmacy  Are there any medications that need to be held prior to surgery and how long? Eliquis for 2 days  Practice name and name of physician performing surgery?      [facility]  What is your office phone and fax number?      Phone- [phone]  [phone]  Anesthesia type (None, local, MAC, general) ?       MAC

## 2020-09-21 ENCOUNTER — Telehealth: Payer: Self-pay | Admitting: General Surgery

## 2020-09-21 ENCOUNTER — Encounter: Payer: Self-pay | Admitting: General Surgery

## 2020-09-21 ENCOUNTER — Encounter: Payer: Self-pay | Admitting: Gastroenterology

## 2020-09-21 NOTE — Telephone Encounter (Signed)
Have been out of the office from 09/02/2020 to 09/20/2020. Faxed anticoagulation clearance to Dr Quillian Quince at [phone]. Copy placed in anticoag file. ta

## 2020-09-24 MED ORDER — CLENPIQ 10-3.5-12 MG-GM -GM/160ML PO SOLN: 1.0000 | ORAL | 0 refills | Status: DC

## 2020-09-24 NOTE — Telephone Encounter (Signed)
Per Dr Quillian Quince, stop Eliquis 2 days prior to procedure and start back 1 day after procedure.

## 2020-09-24 NOTE — Telephone Encounter (Signed)
Patient called back and I went over the instructions for her Eliquis, pt verbalized understanding. She stated her [facility]pi[facility]. Sent in rx

## 2020-09-24 NOTE — Telephone Encounter (Signed)
Left a voicemail on the patients machine to contact the office regarding her Eliquis.

## 2020-10-01 DIAGNOSIS — H2512 Age-related nuclear cataract, left eye: Secondary | ICD-10-CM | POA: Diagnosis not present

## 2020-10-01 DIAGNOSIS — H401131 Primary open-angle glaucoma, bilateral, mild stage: Secondary | ICD-10-CM | POA: Diagnosis not present

## 2020-10-01 DIAGNOSIS — H25043 Posterior subcapsular polar age-related cataract, bilateral: Secondary | ICD-10-CM | POA: Diagnosis not present

## 2020-10-01 DIAGNOSIS — H18413 Arcus senilis, bilateral: Secondary | ICD-10-CM | POA: Diagnosis not present

## 2020-10-01 DIAGNOSIS — H2513 Age-related nuclear cataract, bilateral: Secondary | ICD-10-CM | POA: Diagnosis not present

## 2020-10-05 ENCOUNTER — Encounter: Payer: Self-pay | Admitting: Gastroenterology

## 2020-10-05 ENCOUNTER — Other Ambulatory Visit: Payer: Self-pay | Admitting: Family Medicine

## 2020-10-05 ENCOUNTER — Other Ambulatory Visit: Payer: Self-pay

## 2020-10-05 ENCOUNTER — Ambulatory Visit (AMBULATORY_SURGERY_CENTER): Payer: Medicare Other | Admitting: Gastroenterology

## 2020-10-05 VITALS — BP 120/50 | HR 69 | Temp 97.1°F | Resp 19 | Ht 65.0 in | Wt 170.0 lb

## 2020-10-05 DIAGNOSIS — R1013 Epigastric pain: Secondary | ICD-10-CM

## 2020-10-05 DIAGNOSIS — R195 Other fecal abnormalities: Secondary | ICD-10-CM

## 2020-10-05 DIAGNOSIS — M8000XS Age-related osteoporosis with current pathological fracture, unspecified site, sequela: Secondary | ICD-10-CM

## 2020-10-05 DIAGNOSIS — K635 Polyp of colon: Secondary | ICD-10-CM | POA: Diagnosis not present

## 2020-10-05 DIAGNOSIS — D128 Benign neoplasm of rectum: Secondary | ICD-10-CM

## 2020-10-05 DIAGNOSIS — D649 Anemia, unspecified: Secondary | ICD-10-CM

## 2020-10-05 DIAGNOSIS — D120 Benign neoplasm of cecum: Secondary | ICD-10-CM

## 2020-10-05 DIAGNOSIS — K621 Rectal polyp: Secondary | ICD-10-CM | POA: Diagnosis not present

## 2020-10-05 MED ORDER — SODIUM CHLORIDE 0.9 % IV SOLN: 500.0000 mL | Freq: Once | INTRAVENOUS | Status: DC

## 2020-10-05 NOTE — Progress Notes (Signed)
Called to room to assist during endoscopic procedure.  Patient ID and intended procedure confirmed with present staff. Received instructions for my participation in the procedure from the performing physician.  

## 2020-10-05 NOTE — Op Note (Signed)
[place] Patient Name: Shelia Roth Procedure Date: 10/05/2020 3:03 PM MRN: [id number] Endoscopist: Gerrit Heck , MD Age: 75 Referring MD:  Date of Birth: 19-Oct-1945 Gender: Female Account #: [id number] Procedure:                Colonoscopy Indications:              Positive Cologuard test Medicines:                Monitored Anesthesia Care Procedure:                Pre-Anesthesia Assessment:                           - Prior to the procedure, a History and Physical                            was performed, and patient medications and                            allergies were reviewed. The patient's tolerance of                            previous anesthesia was also reviewed. The risks                            and benefits of the procedure and the sedation                            options and risks were discussed with the patient.                            All questions were answered, and informed consent                            was obtained. Prior Anticoagulants: The patient has                            taken Eliquis (apixaban), last dose was 2 days                            prior to procedure. ASA Grade Assessment: III - A                            patient with severe systemic disease. After                            reviewing the risks and benefits, the patient was                            deemed in satisfactory condition to undergo the                            procedure.  After obtaining informed consent, the colonoscope                            was passed under direct vision. Throughout the                            procedure, the patient's blood pressure, pulse, and                            oxygen saturations were monitored continuously. The                            Colonoscope was introduced through the anus and                            advanced to the the cecum, identified by                             appendiceal orifice and ileocecal valve. The                            colonoscopy was technically difficult and complex                            due to significant looping and a tortuous colon.                            Successful completion of the procedure was aided by                            using manual pressure. The patient tolerated the                            procedure well. The quality of the bowel                            preparation was good. The ileocecal valve,                            appendiceal orifice, and rectum were photographed. Scope In: 3:07:26 PM Scope Out: 3:42:46 PM Scope Withdrawal Time: 0 hours 18 minutes 48 seconds  Total Procedure Duration: 0 hours 35 minutes 20 seconds  Findings:                 The perianal and digital rectal examinations were                            normal.                           A 12 mm polyp was found in the cecum. The polyp was                            flat with adherent mucus cap. The polyp was removed  with a cold snare. Resection and retrieval were                            complete. Estimated blood loss was minimal.                           Two sessile polyps were found in the recto-sigmoid                            colon. The polyps were 2 to 3 mm in size. These                            polyps were removed with a cold snare. Resection                            and retrieval were complete. Estimated blood loss                            was minimal.                           The sigmoid colon and hepatic flexure were                            significantly tortuous with significant looping in                            the ascending colon. Advancing the scope required                            using manual pressure. The distal sigmoid colon                            lumen was mildly narrowed circumferentially, but                            overlying mucosa was normal  appearing.                           The retroflexed view of the distal rectum and anal                            verge was normal and showed no anal or rectal                            abnormalities. Complications:            No immediate complications. Estimated Blood Loss:     Estimated blood loss was minimal. Impression:               - One 12 mm polyp in the cecum, removed with a cold                            snare. Resected and retrieved.                           -  Two 2 to 3 mm polyps at the recto-sigmoid colon,                            removed with a cold snare. Resected and retrieved.                           - Tortuous colon.                           - The distal rectum and anal verge are normal on                            retroflexion view. Recommendation:           - Patient has a contact number available for                            emergencies. The signs and symptoms of potential                            delayed complications were discussed with the                            patient. Return to normal activities tomorrow.                            Written discharge instructions were provided to the                            patient.                           - Resume previous diet.                           - Continue present medications.                           - Await pathology results.                           - Repeat colonoscopy in 3 years for surveillance.                            Recommend placement of abdominal binder in                            pre-operative area and consider using ultraslim                            colonoscope.                           - Perform an upper GI endoscopy at appointment to  be scheduled following evaluation by dentist for                            loose tooth.                           - Resume Eliquis (apixaban) at prior dose in 2 days. Gerrit Heck, MD 10/05/2020 3:53:25 PM

## 2020-10-05 NOTE — Progress Notes (Signed)
During pre-operative evaluation, patient noted to have very loose tooth on bottom left. She states this was knocked loose when grandson accidentally bumped her with his head. Examined mouth and tooth very loose, and apparently detached at the root, but still connected via gum. Discussed elevated risk of losing tooth with bite block placement and EGD, which could pose an aspiration risk. Discussed risks at length, and she ultimately decided to postpone EGD in favor of dental evaluation and rescheduling. Ok to proceed with colonoscopy as scheduled.

## 2020-10-05 NOTE — Progress Notes (Signed)
To PACU, VSS. Report to [email or web address] 

## 2020-10-05 NOTE — Progress Notes (Signed)
VS- JD 

## 2020-10-05 NOTE — Progress Notes (Signed)
Pt d/c with tooth intact, no issues while in PACU

## 2020-10-05 NOTE — Patient Instructions (Signed)
Handout given for polyps.   RESUME ELIQUIS  Thursday 10/07/20.    YOU HAD AN ENDOSCOPIC PROCEDURE TODAY AT [facility]:   Refer to the procedure report that was given to you for any specific questions about what was found during the examination.  If the procedure report does not answer your questions, please call your gastroenterologist to clarify.  If you requested that your care partner not be given the details of your procedure findings, then the procedure report has been included in a sealed envelope for you to review at your convenience later.  YOU SHOULD EXPECT: Some feelings of bloating in the abdomen. Passage of more gas than usual.  Walking can help get rid of the air that was put into your GI tract during the procedure and reduce the bloating. If you had a lower endoscopy (such as a colonoscopy or flexible sigmoidoscopy) you may notice spotting of blood in your stool or on the toilet paper. If you underwent a bowel prep for your procedure, you may not have a normal bowel movement for a few days.  Please Note:  You might notice some irritation and congestion in your nose or some drainage.  This is from the oxygen used during your procedure.  There is no need for concern and it should clear up in a day or so.  SYMPTOMS TO REPORT IMMEDIATELY:   Following lower endoscopy (colonoscopy or flexible sigmoidoscopy):  Excessive amounts of blood in the stool  Significant tenderness or worsening of abdominal pains  Swelling of the abdomen that is new, acute  Fever of 100F or higher  For urgent or emergent issues, a gastroenterologist can be reached at any hour by calling [phone]. Do not use MyChart messaging for urgent concerns.    DIET:  We do recommend a small meal at first, but then you may proceed to your regular diet.  Drink plenty of fluids but you should avoid alcoholic beverages for 24 hours.  ACTIVITY:  You should plan to take it easy for the rest of today  and you should NOT DRIVE or use heavy machinery until tomorrow (because of the sedation medicines used during the test).    FOLLOW UP: Our staff will call the number listed on your records 48-72 hours following your procedure to check on you and address any questions or concerns that you may have regarding the information given to you following your procedure. If we do not reach you, we will leave a message.  We will attempt to reach you two times.  During this call, we will ask if you have developed any symptoms of COVID 19. If you develop any symptoms (ie: fever, flu-like symptoms, shortness of breath, cough etc.) before then, please call [phone].  If you test positive for Covid 19 in the 2 weeks post procedure, please call and report this information to [place].    If any biopsies were taken you will be contacted by phone or by letter within the next 1-3 weeks.  Please call us at [phone] if you have not heard about the biopsies in 3 weeks.    SIGNATURES/CONFIDENTIALITY: You and/or your care partner have signed paperwork which will be entered into your electronic medical record.  These signatures attest to the fact that that the information above on your After Visit Summary has been reviewed and is understood.  Full responsibility of the confidentiality of this discharge information lies with you and/or your care-partner.

## 2020-10-07 ENCOUNTER — Telehealth: Payer: Self-pay | Admitting: *Deleted

## 2020-10-07 NOTE — Telephone Encounter (Signed)
  Follow up Call-  Call back number 10/05/2020  Post procedure Call Back phone  # [phone]  Permission to leave phone message Yes  Some recent data might be hidden     Patient questions:  Do you have a fever, pain , or abdominal swelling? No. Pain Score  0 *  Have you tolerated food without any problems? Yes.    Have you been able to return to your normal activities? Yes.    Do you have any questions about your discharge instructions: Diet   No. Medications  No. Follow up visit  No.  Do you have questions or concerns about your Care? No.  Actions: * If pain score is 4 or above: No action needed, pain <4.  1. Have you developed a fever since your procedure? no  2.   Have you had an respiratory symptoms (SOB or cough) since your procedure? no  3.   Have you tested positive for COVID 19 since your procedure no  4.   Have you had any family members/close contacts diagnosed with the COVID 19 since your procedure?  no   If yes to any of these questions please route to Joylene John, RN and Joella Prince, RN

## 2020-10-11 ENCOUNTER — Encounter: Payer: Self-pay | Admitting: Gastroenterology

## 2020-10-14 ENCOUNTER — Encounter: Payer: Medicare Other | Admitting: Gastroenterology

## 2020-10-18 ENCOUNTER — Ambulatory Visit: Payer: Medicare Other | Admitting: Family Medicine

## 2020-10-18 ENCOUNTER — Telehealth: Payer: Self-pay

## 2020-10-18 NOTE — Progress Notes (Signed)
Chronic Care Management Pharmacy Assistant   Name: Shelia Roth  MRN: [id number] DOB: 1946-06-17  Reason for Encounter: Medication Review  Patient Questions:  1.  Have you seen any other providers since your last visit? No  2.  Any changes in your medicines or health? No    PCP : Libby Maw, MD  Allergies:   Allergies  Allergen Reactions  . Codeine Shortness Of Breath    Medications: Outpatient Encounter Medications as of 10/18/2020  Medication Sig Note  . alendronate (FOSAMAX) 70 MG tablet TAKE ONE TABLET BY MOUTH ONCE WEEKLY BEFORE BREAKFAST ON THURSDAYS   . amLODipine (NORVASC) 5 MG tablet TAKE ONE TABLET BY MOUTH EVERY MORNING   . Calcium Carb-Cholecalciferol (CALCIUM + VITAMIN D3 PO) Take 1 tablet by mouth 2 (two) times daily. Calcium 600mg  and Vitamin d3 34mcg   . carvedilol (COREG) 12.5 MG tablet TAKE ONE TABLET BY MOUTH AT BREAKFAST AND AT BEDTIME   . diclofenac Sodium (VOLTAREN) 1 % GEL Apply 2 g topically 4 (four) times daily as needed.   . DUREZOL 0.05 % EMUL Place 1 drop into the left eye 4 (four) times daily. (Patient not taking: Reported on 10/05/2020)   . ELIQUIS 5 MG TABS tablet TAKE ONE TABLET BY MOUTH EVERY MORNING and TAKE ONE TABLET BY MOUTH EVERYDAY AT BEDTIME   . flecainide (TAMBOCOR) 50 MG tablet Take 50 mg by mouth 2 (two) times daily. 08/19/2020: [facility]  . losartan (COZAAR) 100 MG tablet TAKE ONE TABLET BY MOUTH EVERY MORNING   . mometasone (NASONEX) 50 MCG/ACT nasal spray Place 2 sprays into the nose daily.   . Multiple Vitamins-Minerals (EMERGEN-C IMMUNE PLUS) PACK Take 1 Package by mouth daily.    No facility-administered encounter medications on file as of 10/18/2020.    Current Diagnosis: Patient Active Problem List   Diagnosis Date Noted  . Iron deficiency 04/14/2020  . Elevated LDL cholesterol level 01/13/2020  . Anemia 01/13/2020  . Post-nasal drip 10/15/2019  . Screen for colon  cancer 09/03/2018  . Age-related osteoporosis with current pathological fracture 10/01/2017  . Vitamin D deficiency 10/01/2017  . PAF (paroxysmal atrial fibrillation) ([place]) 10/01/2017  . Essential hypertension 10/01/2017  . LV dysfunction 10/01/2017    Goals Addressed   None    Reviewed chart for medication changes ahead of medication coordination call.  No OVs, Consults, or hospital visits since last care coordination call/Pharmacist visit. No medication changes indicated  BP Readings from Last 3 Encounters:  10/05/20 (!) 120/50  09/14/20 112/70  05/20/20 128/70    No results found for: HGBA1C   Patient obtains medications through Adherence Packaging  30 Days   Last adherence delivery included:  Alendronate (FosaMax) 70 Mg Tablet Weekly on Thursdays -Before Breakfast Amlodipine 5 MG tablet Daily -Breakfast Eliquis 5 MG Tablet Twice daily- Breakfast and bedtime Carvedilol 12.5mg  Tablet Twice daily- Breakfast and bedtime Losartan 100 mg Tablet - Breakfast Mometasone (Nasonex) 50 MCG/ACT Nasal spray - 2 sprays daily Flecainide 50 mg tablet twice daily -Breakfast and bedtime  Patient declined medication last month:  Voltaren 1% Gel PRN- (adequate Supply) Calcium + Vit D (Adequate Supply - OTC) Emergen-C Immune Plus (Adequate Supply - OTC)  Ocuvite-Luteine Multivitamin (Adequate Supply - OTC)  Patient is due for next adherence delivery on: 10/25/2020. Called patient and reviewed medications and coordinated delivery.  This delivery to include: . Alendronate (FosaMax) 70 Mg Tablet Weekly on Thursdays - Before Breakfast . Amlodipine  5 MG tablet Daily -Breakfast . Eliquis 5 MG Tablet Twice daily- Breakfast and bedtime . Carvedilol 12.5mg  Tablet Twice daily- Breakfast and bedtime . Losartan 100 mg Tablet - Breakfast . Mometasone (Nasonex) 50 MCG/ACT Nasal spray - 2 sprays  daily . Flecainide 50 mg tablet twice daily - Breakfast and bedtime   Patient will not need a short fill of medication, prior to adherence delivery.  Patient will not need a  acute fill of medication, prior to adherence delivery.  Patient declined the following medications: Marland Kitchen Voltaren 1% Gel PRN- (adequate Supply) . Calcium + Vit D (Adequate Supply - OTC) . Emergen-C Immune Plus (Adequate Supply - OTC)  . Ocuvite-Luteine Multivitamin (Adequate Supply - OTC)  Patient needs refills for None ID  .  Confirmed delivery date of 10/25/2020, advised patient that pharmacy will contact them the morning of delivery.  Blood Pressure reading: . On 10/18/2020 it was 125/75. . On 10/17/2020 it was 112/70. . On 10/16/2020 it was 131/73. . On 10/15/2020 it was 104/58 Follow-Up:  Pharmacist Review   Bessie Wales Pharmacist Assistant [phone]

## 2020-10-26 ENCOUNTER — Telehealth: Payer: Self-pay | Admitting: General Surgery

## 2020-10-26 DIAGNOSIS — K219 Gastro-esophageal reflux disease without esophagitis: Secondary | ICD-10-CM

## 2020-10-26 NOTE — Telephone Encounter (Signed)
-----   Message from [place], DO sent at 10/05/2020  4:08 PM EST ----- Patient will need EGD rescheduled for some time after she can be seen by her dentist for probable tooth extraction. Thanks.

## 2020-10-26 NOTE — Telephone Encounter (Signed)
Patient contacted the office stating she had her tooth removed the Friday right after her procedure and then was on vacation. She scheduled the EGD on 12/08/2020 will send to her Dr for eliquis clearance. Patient does not have access to [facility] patient instructions.

## 2020-10-26 NOTE — Telephone Encounter (Signed)
Left a message on the patients voicemail to contact the office. When she calls we need to find out if she has had her Dental treatment yet and if she has reschedule her EGD.

## 2020-11-03 ENCOUNTER — Telehealth: Payer: Self-pay | Admitting: General Surgery

## 2020-11-03 NOTE — Telephone Encounter (Signed)
Received notification from Dr Bishop Limbo that the patient may stop her Eliquis 2 days prior to her procedure and restart it 1 day after. Left a voicemail on the patients machine to call back to notify her of this.

## 2020-11-03 NOTE — Telephone Encounter (Signed)
-----   Message from Letta Pate, [place] sent at 10/26/2020  1:36 PM EST ----- Regarding: anticoag letter EGD 12/08/2020

## 2020-11-04 ENCOUNTER — Telehealth: Payer: Self-pay | Admitting: General Surgery

## 2020-11-04 NOTE — Telephone Encounter (Signed)
Patient notified of anticoagulation clearance. Patient verbalized understandfing

## 2020-11-04 NOTE — Telephone Encounter (Signed)
-----   Message from Letta Pate, [place] sent at 10/26/2020  1:36 PM EST ----- Regarding: anticoag letter EGD 12/08/2020

## 2020-11-15 ENCOUNTER — Telehealth: Payer: Self-pay

## 2020-11-15 NOTE — Progress Notes (Signed)
Chronic Care Management Pharmacy Assistant   Name: Shelia Roth  MRN: [id number] DOB: [birth year]  Reason for Encounter: Medication Review /Medication Coordination Call.    Recent office visits:  No recent Office visit  Recent consult visits:  No recent consult visits.   Hospital visits:  None in previous 6 months  Medications: Outpatient Encounter Medications as of 11/15/2020  Medication Sig Note  . alendronate (FOSAMAX) 70 MG tablet TAKE ONE TABLET BY MOUTH ONCE WEEKLY BEFORE BREAKFAST ON THURSDAYS   . amLODipine (NORVASC) 5 MG tablet TAKE ONE TABLET BY MOUTH EVERY MORNING   . Calcium Carb-Cholecalciferol (CALCIUM + VITAMIN D3 PO) Take 1 tablet by mouth 2 (two) times daily. Calcium 600mg  and Vitamin d3 53mcg   . carvedilol (COREG) 12.5 MG tablet TAKE ONE TABLET BY MOUTH AT BREAKFAST AND AT BEDTIME   . diclofenac Sodium (VOLTAREN) 1 % GEL Apply 2 g topically 4 (four) times daily as needed.   . DUREZOL 0.05 % EMUL Place 1 drop into the left eye 4 (four) times daily. (Patient not taking: Reported on 10/05/2020)   . ELIQUIS 5 MG TABS tablet TAKE ONE TABLET BY MOUTH EVERY MORNING and TAKE ONE TABLET BY MOUTH EVERYDAY AT BEDTIME   . flecainide (TAMBOCOR) 50 MG tablet Take 50 mg by mouth 2 (two) times daily. 08/19/2020: Pres[facility]  . losartan (COZAAR) 100 MG tablet TAKE ONE TABLET BY MOUTH EVERY MORNING   . mometasone (NASONEX) 50 MCG/ACT nasal spray Place 2 sprays into the nose daily.   . Multiple Vitamins-Minerals (EMERGEN-C IMMUNE PLUS) PACK Take 1 Package by mouth daily.    No facility-administered encounter medications on file as of 11/15/2020.    Star Rating Drugs:losartan 100 mg   Reviewed chart for medication changes ahead of medication coordination call.   BP Readings from Last 3 Encounters:  10/05/20 (!) 120/50  09/14/20 112/70  05/20/20 128/70    No results found for: HGBA1C   Patient obtains medications through Adherence  Packaging  30 Days   Last adherence delivery included:   Alendronate (FosaMax) 70 Mg Tablet Weekly on Thursdays -Before Breakfast  Amlodipine 5 MG tablet Daily -Breakfast  Eliquis 5 MG Tablet Twice daily- Breakfast and bedtime  Carvedilol 12.5mg  Tablet Twice daily- Breakfast and bedtime  Losartan 100 mg Tablet - Breakfast  Mometasone (Nasonex) 50 MCG/ACT Nasal spray - 2 sprays daily  Flecainide 50 mg tablet twice daily -Breakfast and bedtime  Patient declined medication last month   Voltaren 1% Gel PRN- (adequate Supply)  Calcium + Vit D (Adequate Supply - OTC)  Emergen-C Immune Plus (Adequate Supply - OTC)   Ocuvite-Luteine Multivitamin (Adequate Supply - OTC)  Patient is due for next adherence delivery on: 11/24/2020. Called patient and reviewed medications and coordinated delivery.  This delivery to include:  Alendronate (FosaMax) 70 Mg Tablet Weekly on Thursdays -Before Breakfast  Amlodipine 5 MG tablet Daily -Breakfast  Eliquis 5 MG Tablet Twice daily- Breakfast and bedtime  Carvedilol 12.5mg  Tablet Twice daily- Breakfast and bedtime  Losartan 100 mg Tablet - Breakfast  Mometasone (Nasonex) 50 MCG/ACT Nasal spray - 2 sprays daily  Flecainide 50 mg tablet twice daily -Breakfast and bedtime  Patient declined the following medications:  Voltaren 1% Gel PRN- (adequate Supply)  Calcium + Vit D (Adequate Supply - OTC)  Emergen-C Immune Plus (Adequate Supply - OTC)   Ocuvite-Luteine Multivitamin (Adequate Supply - OTC)  Patient needs refills for None ID.  Confirmed delivery date of  11/24/2020, advised patient that pharmacy will contact them the morning of delivery.  Patient states she is going to have cataract eye surgery on her left eye and after a few weeks on her right eye.   [place] Pharmacist Assistant [phone]

## 2020-11-19 DIAGNOSIS — H2511 Age-related nuclear cataract, right eye: Secondary | ICD-10-CM | POA: Diagnosis not present

## 2020-11-19 DIAGNOSIS — H2512 Age-related nuclear cataract, left eye: Secondary | ICD-10-CM | POA: Diagnosis not present

## 2020-11-30 ENCOUNTER — Other Ambulatory Visit: Payer: Self-pay

## 2020-12-01 ENCOUNTER — Ambulatory Visit (INDEPENDENT_AMBULATORY_CARE_PROVIDER_SITE_OTHER): Payer: Medicare Other | Admitting: Family Medicine

## 2020-12-01 ENCOUNTER — Encounter: Payer: Self-pay | Admitting: Family Medicine

## 2020-12-01 VITALS — BP 110/68 | HR 52 | Temp 97.1°F | Ht 65.0 in | Wt 171.6 lb

## 2020-12-01 DIAGNOSIS — E7800 Pure hypercholesterolemia, unspecified: Secondary | ICD-10-CM | POA: Diagnosis not present

## 2020-12-01 DIAGNOSIS — D649 Anemia, unspecified: Secondary | ICD-10-CM | POA: Diagnosis not present

## 2020-12-01 DIAGNOSIS — E559 Vitamin D deficiency, unspecified: Secondary | ICD-10-CM

## 2020-12-01 DIAGNOSIS — I10 Essential (primary) hypertension: Secondary | ICD-10-CM

## 2020-12-01 DIAGNOSIS — M8000XS Age-related osteoporosis with current pathological fracture, unspecified site, sequela: Secondary | ICD-10-CM | POA: Diagnosis not present

## 2020-12-01 LAB — URINALYSIS, ROUTINE W REFLEX MICROSCOPIC
Bilirubin Urine: NEGATIVE
Hgb urine dipstick: NEGATIVE
Ketones, ur: NEGATIVE
Leukocytes,Ua: NEGATIVE
Nitrite: NEGATIVE
Specific Gravity, Urine: >=1.03 — AB (ref 1.000–1.030)
Total Protein, Urine: NEGATIVE
Urine Glucose: NEGATIVE
Urobilinogen, UA: 0.2 (ref 0.0–1.0)
pH: 5 (ref 5.0–8.0)

## 2020-12-01 LAB — COMPREHENSIVE METABOLIC PANEL
ALT: 9 U/L (ref 0–35)
AST: 12 U/L (ref 0–37)
Albumin: 3.8 g/dL (ref 3.5–5.2)
Alkaline Phosphatase: 67 U/L (ref 39–117)
BUN: 26 mg/dL — ABNORMAL HIGH (ref 6–23)
CO2: 28 mEq/L (ref 19–32)
Calcium: 9 mg/dL (ref 8.4–10.5)
Chloride: 105 mEq/L (ref 96–112)
Creatinine, Ser: 0.88 mg/dL (ref 0.40–1.20)
GFR: 65.06 mL/min (ref >60.00)
Glucose, Bld: 106 mg/dL — ABNORMAL HIGH (ref 70–99)
Potassium: 4.4 mEq/L (ref 3.5–5.1)
Sodium: 139 mEq/L (ref 135–145)
Total Bilirubin: 0.6 mg/dL (ref 0.2–1.2)
Total Protein: 6.9 g/dL (ref 6.0–8.3)

## 2020-12-01 LAB — CBC
HCT: 34.4 % — ABNORMAL LOW (ref 36.0–46.0)
Hemoglobin: 11.5 g/dL — ABNORMAL LOW (ref 12.0–15.0)
MCHC: 33.6 g/dL (ref 30.0–36.0)
MCV: 91.8 fl (ref 78.0–100.0)
Platelets: 237 10*3/uL (ref 150.0–400.0)
RBC: 3.74 Mil/uL — ABNORMAL LOW (ref 3.87–5.11)
RDW: 13.5 % (ref 11.5–15.5)
WBC: 4.6 10*3/uL (ref 4.0–10.5)

## 2020-12-01 LAB — LIPID PANEL
Cholesterol: 194 mg/dL (ref 0–200)
HDL: 53.6 mg/dL (ref >39.00)
LDL Cholesterol: 124 mg/dL — ABNORMAL HIGH (ref 0–99)
NonHDL: 140.71
Total CHOL/HDL Ratio: 4
Triglycerides: 82 mg/dL (ref 0.0–149.0)
VLDL: 16.4 mg/dL (ref 0.0–40.0)

## 2020-12-01 LAB — B12 AND FOLATE PANEL
Folate: >23.6 ng/mL (ref >5.9)
Vitamin B-12: 422 pg/mL (ref 211–911)

## 2020-12-01 LAB — VITAMIN D 25 HYDROXY (VIT D DEFICIENCY, FRACTURES): VITD: 59.26 ng/mL (ref 30.00–100.00)

## 2020-12-01 NOTE — Progress Notes (Addendum)
Established Patient Office Visit  Subjective:  Patient ID: Shelia Roth, female    DOB: [birth year]  Age: 75 y.o. MRN: [id number]  CC:  Chief Complaint  Patient presents with  . Follow-up    Routine follow up, no concerns. Patient fasting.     HPI Shelia Roth presents for follow-up of hypertension, vitamin D deficiency, osteoporosis, unspecified anemia that is on the macrocytic side.  Patient had positive Hemoccults and was sent for colonoscopy.  She tells me that a 12 cm polyp was discovered and removed.  Fortunately it was benign.  She had also been taking ibuprofen at that time.  She continues to take 1 iron pill daily.  Continues with her Fosamax.  She believes that we started it back in 2017.  She continues taking calcium and vitamin D.  Cataract extraction OS first of this month.  Visual differences amazing for her.  She is exercising daily by walking.  Her knees bother her from time to time.  She uses Voltaren gel as needed.  Past Medical History:  Diagnosis Date  . Asthma   . Atrial fibrillation ([employer])   . Cataract   . Chicken pox   . Gallstones   . Glaucoma   . Hyperlipidemia    pt denies, never taken meds  . Hypertension   . Osteoporosis   . Thyroid disease   . UTI (urinary tract infection)     Past Surgical History:  Procedure Laterality Date  . ABDOMINAL HYSTERECTOMY    . APPENDECTOMY  1984  . BREAST SURGERY    . CHOLECYSTECTOMY  1980  . COLONOSCOPY  2011   [facility]  . ESOPHAGOGASTRODUODENOSCOPY     right after gallbladder removal   . MASTECTOMY Bilateral    for mastitis. Says to do blood pressure on left   . PARTIAL HIP ARTHROPLASTY  2017   replacement of head per patient   . THYROIDECTOMY  1991    Family History  Problem Relation Age of Onset  . COPD Father   . Heart disease Father   . Hypertension Father   . Stomach cancer Father        said that they autospy didn't state but dr thinks he did have this. Died with internal bleeding and  cardiac arrest   . Diabetes Brother   . Hyperlipidemia Brother   . Heart disease Daughter   . Stroke Maternal Grandmother   . Heart attack Maternal Grandfather   . Heart attack Paternal Grandmother   . Early death Paternal Grandfather   . Colon cancer Neg Hx   . Esophageal cancer Neg Hx   . Rectal cancer Neg Hx     Social History   Socioeconomic History  . Marital status: Divorced    Spouse name: Not on file  . Number of children: 1  . Years of education: Not on file  . Highest education level: Not on file  Occupational History  . Occupation: retired  Tobacco Use  . Smoking status: Never Smoker  . Smokeless tobacco: Never Used  Vaping Use  . Vaping Use: Never used  Substance and Sexual Activity  . Alcohol use: No  . Drug use: Never  . Sexual activity: Never  Other Topics Concern  . Not on file  Social History Narrative  . Not on file   Social Determinants of Health   Financial Resource Strain: Low Risk   . Difficulty of Paying Living Expenses: Not hard at all  Food Insecurity: No  Food Insecurity  . Worried About Charity fundraiser in the Last Year: Never true  . Ran Out of Food in the Last Year: Never true  Transportation Needs: No Transportation Needs  . Lack of Transportation (Medical): No  . Lack of Transportation (Non-Medical): No  Physical Activity: Not on file  Stress: No Stress Concern Present  . Feeling of Stress : Not at all  Social Connections: Moderately Integrated  . Frequency of Communication with Friends and Family: More than three times a week  . Frequency of Social Gatherings with Friends and Family: More than three times a week  . Attends Religious Services: More than 4 times per year  . Active Member of Clubs or Organizations: Yes  . Attends Archivist Meetings: More than 4 times per year  . Marital Status: Divorced  Human resources officer Violence: Not At Risk  . Fear of Current or Ex-Partner: No  . Emotionally Abused: No  .  Physically Abused: No  . Sexually Abused: No    Outpatient Medications Prior to Visit  Medication Sig Dispense Refill  . alendronate (FOSAMAX) 70 MG tablet TAKE ONE TABLET BY MOUTH ONCE WEEKLY BEFORE BREAKFAST ON THURSDAYS 4 tablet 2  . amLODipine (NORVASC) 5 MG tablet TAKE ONE TABLET BY MOUTH EVERY MORNING 90 tablet 5  . Calcium Carb-Cholecalciferol (CALCIUM + VITAMIN D3 PO) Take 1 tablet by mouth 2 (two) times daily. Calcium 600mg  and Vitamin d3 7mcg    . carvedilol (COREG) 12.5 MG tablet TAKE ONE TABLET BY MOUTH AT BREAKFAST AND AT BEDTIME 180 tablet 5  . diclofenac Sodium (VOLTAREN) 1 % GEL Apply 2 g topically 4 (four) times daily as needed.    Marland Kitchen ELIQUIS 5 MG TABS tablet TAKE ONE TABLET BY MOUTH EVERY MORNING and TAKE ONE TABLET BY MOUTH EVERYDAY AT BEDTIME 60 tablet 5  . flecainide (TAMBOCOR) 50 MG tablet Take 50 mg by mouth 2 (two) times daily.    Marland Kitchen losartan (COZAAR) 100 MG tablet TAKE ONE TABLET BY MOUTH EVERY MORNING 90 tablet 3  . mometasone (NASONEX) 50 MCG/ACT nasal spray Place 2 sprays into the nose daily. 17 g 12  . Multiple Vitamins-Minerals (EMERGEN-C IMMUNE PLUS) PACK Take 1 Package by mouth daily.    Marland Kitchen BESIVANCE 0.6 % SUSP Place 1 drop into the right eye 3 (three) times daily.    . dorzolamide-timolol (COSOPT) 22.3-6.8 MG/ML ophthalmic solution 1 drop 2 (two) times daily.    . DUREZOL 0.05 % EMUL Place 1 drop into the left eye 4 (four) times daily. (Patient not taking: No sig reported)     No facility-administered medications prior to visit.    Allergies  Allergen Reactions  . Codeine Shortness Of Breath    ROS Review of Systems  Constitutional: Negative.   HENT: Negative.   Eyes: Negative for photophobia and visual disturbance.  Respiratory: Negative.   Cardiovascular: Negative.   Gastrointestinal: Negative.   Endocrine: Negative for polyphagia and polyuria.  Genitourinary: Negative.  Negative for dysuria and frequency.  Musculoskeletal: Positive for  arthralgias.  Skin: Negative for pallor and rash.  Allergic/Immunologic: Negative for immunocompromised state.  Neurological: Negative.   Hematological: Does not bruise/bleed easily.  Psychiatric/Behavioral: Negative.       Objective:    Physical Exam Vitals and nursing note reviewed.  Constitutional:      General: She is not in acute distress.    Appearance: Normal appearance. She is not ill-appearing, toxic-appearing or diaphoretic.  HENT:  Head: Normocephalic and atraumatic.     Right Ear: Tympanic membrane, ear canal and external ear normal.     Left Ear: Tympanic membrane, ear canal and external ear normal.     Mouth/Throat:     Mouth: Mucous membranes are moist.     Pharynx: Oropharynx is clear. No oropharyngeal exudate or posterior oropharyngeal erythema.  Eyes:     General: No scleral icterus.       Right eye: No discharge.        Left eye: No discharge.     Extraocular Movements: Extraocular movements intact.     Conjunctiva/sclera: Conjunctivae normal.     Pupils: Pupils are equal, round, and reactive to light.  Neck:     Vascular: No carotid bruit.  Cardiovascular:     Rate and Rhythm: Normal rate and regular rhythm.     Pulses:          Carotid pulses are 2+ on the right side and 2+ on the left side.      Radial pulses are 2+ on the right side and 2+ on the left side.  Pulmonary:     Effort: Pulmonary effort is normal.     Breath sounds: Normal breath sounds.  Abdominal:     General: Bowel sounds are normal.  Musculoskeletal:     Cervical back: No rigidity or tenderness.  Lymphadenopathy:     Cervical: No cervical adenopathy.  Skin:    General: Skin is warm and dry.  Neurological:     Mental Status: She is alert and oriented to person, place, and time.  Psychiatric:        Mood and Affect: Mood normal.        Behavior: Behavior normal.     BP 110/68   Pulse (!) 52   Temp (!) 97.1 F (36.2 C) (Temporal)   Ht 5\' 5"  (1.651 m)   Wt 171 lb 9.6 oz  (77.8 kg)   LMP  (LMP Unknown)   SpO2 94%   BMI 28.56 kg/m  Wt Readings from Last 3 Encounters:  12/08/20 170 lb (77.1 kg)  12/01/20 171 lb 9.6 oz (77.8 kg)  10/05/20 170 lb (77.1 kg)     Health Maintenance Due  Topic Date Due  . Hepatitis C Screening  Never done  . TETANUS/TDAP  Never done    There are no preventive care reminders to display for this patient.  Lab Results  Component Value Date   TSH 0.48 01/13/2020   Lab Results  Component Value Date   WBC 4.6 12/01/2020   HGB 11.5 (L) 12/01/2020   HCT 34.4 (L) 12/01/2020   MCV 91.8 12/01/2020   PLT 237.0 12/01/2020   Lab Results  Component Value Date   NA 139 12/01/2020   K 4.4 12/01/2020   CO2 28 12/01/2020   GLUCOSE 106 (H) 12/01/2020   BUN 26 (H) 12/01/2020   CREATININE 0.88 12/01/2020   BILITOT 0.6 12/01/2020   ALKPHOS 67 12/01/2020   AST 12 12/01/2020   ALT 9 12/01/2020   PROT 6.9 12/01/2020   ALBUMIN 3.8 12/01/2020   CALCIUM 9.0 12/01/2020   GFR 65.06 12/01/2020   Lab Results  Component Value Date   CHOL 194 12/01/2020   Lab Results  Component Value Date   HDL 53.60 12/01/2020   Lab Results  Component Value Date   LDLCALC 124 (H) 12/01/2020   Lab Results  Component Value Date   TRIG 82.0 12/01/2020   Lab Results  Component Value Date   CHOLHDL 4 12/01/2020   No results found for: HGBA1C    Assessment & Plan:   Problem List Items Addressed This Visit      Cardiovascular and Mediastinum   Essential hypertension - Primary   Relevant Medications   atorvastatin (LIPITOR) 10 MG tablet   Other Relevant Orders   CBC (Completed)   Urinalysis, Routine w reflex microscopic (Completed)   Comprehensive metabolic panel (Completed)     Musculoskeletal and Integument   Age-related osteoporosis with current pathological fracture     Other   Vitamin D deficiency   Relevant Orders   VITAMIN D 25 Hydroxy (Vit-D Deficiency, Fractures) (Completed)   Elevated LDL cholesterol level    Relevant Medications   atorvastatin (LIPITOR) 10 MG tablet   Other Relevant Orders   Lipid panel (Completed)   Anemia   Relevant Orders   CBC (Completed)   B12 and Folate Panel (Completed)   Iron, TIBC and Ferritin Panel (Completed)      Meds ordered this encounter  Medications  . atorvastatin (LIPITOR) 10 MG tablet    Sig: Take 1 tablet (10 mg total) by mouth daily.    Dispense:  90 tablet    Refill:  3    Follow-up: Return in about 6 months (around 06/02/2021).  Continue all medicines as above.  Adjustments made pending results of lab work.  May be nearing end of therapy or Fosamax.  Will be checking another DEXA scan before ending therapy.  Continue iron, calcium and vitamin D therapy.  LDL slightly elevated.  Patient was given information on lowering her cholesterol.  Libby Maw, MD

## 2020-12-01 NOTE — Patient Instructions (Signed)
Preventing High Cholesterol Cholesterol is a white, waxy substance similar to fat that the human body needs to help build cells. The liver makes all the cholesterol that a person's body needs. Having high cholesterol (hypercholesterolemia) increases your risk for heart disease and stroke. Extra or excess cholesterol comes from the food that you eat. High cholesterol can often be prevented with diet and lifestyle changes. If you already have high cholesterol, you can control it with diet, lifestyle changes, and medicines. How can high cholesterol affect me? If you have high cholesterol, fatty deposits (plaques) may build up on the walls of your blood vessels. The blood vessels that carry blood away from your heart are called arteries. Plaques make the arteries narrower and stiffer. This in turn can:  Restrict or block blood flow and cause blood clots to form.  Increase your risk for heart attack and stroke. What can increase my risk for high cholesterol? This condition is more likely to develop in people who:  Eat foods that are high in saturated fat or cholesterol. Saturated fat is mostly found in foods that come from animal sources.  Are overweight.  Are not getting enough exercise.  Have a family history of high cholesterol (familial hypercholesterolemia). What actions can I take to prevent this? Nutrition  Eat less saturated fat.  Avoid trans fats (partially hydrogenated oils). These are often found in margarine and in some baked goods, fried foods, and snacks bought in packages.  Avoid precooked or cured meat, such as bacon, sausages, or meat loaves.  Avoid foods and drinks that have added sugars.  Eat more fruits, vegetables, and whole grains.  Choose healthy sources of protein, such as fish, poultry, lean cuts of red meat, beans, peas, lentils, and nuts.  Choose healthy sources of fat, such as: ? Nuts. ? Vegetable oils, especially olive oil. ? Fish that have healthy fats,  such as omega-3 fatty acids. These fish include mackerel or salmon.   Lifestyle  Lose weight if you are overweight. Maintaining a healthy body mass index (BMI) can help prevent or control high cholesterol. It can also lower your risk for diabetes and high blood pressure. Ask your health care provider to help you with a diet and exercise plan to lose weight safely.  Do not use any products that contain nicotine or tobacco, such as cigarettes, e-cigarettes, and chewing tobacco. If you need help quitting, ask your health care provider. Alcohol use  Do not drink alcohol if: ? Your health care provider tells you not to drink. ? You are pregnant, may be pregnant, or are planning to become pregnant.  If you drink alcohol: ? Limit how much you use to:  0-1 drink a day for women.  0-2 drinks a day for men. ? Be aware of how much alcohol is in your drink. In the [place] one drink equals one 12 oz bottle of beer (355 mL), one 5 oz glass of wine (148 mL), or one 1 oz glass of hard liquor (44 mL). Activity  Get enough exercise. Do exercises as told by your health care provider.  Each week, do at least 150 minutes of exercise that takes a medium level of effort (moderate-intensity exercise). This kind of exercise: ? Makes your heart beat faster while allowing you to still be able to talk. ? Can be done in short sessions several times a day or longer sessions a few times a week. For example, on 5 days each week, you could walk fast or ride   your bike 3 times a day for 10 minutes each time.   Medicines  Your health care provider may recommend medicines to help lower cholesterol. This may be a medicine to lower the amount of cholesterol that your liver makes. You may need medicine if: ? Diet and lifestyle changes have not lowered your cholesterol enough. ? You have high cholesterol and other risk factors for heart disease or stroke.  Take over-the-counter and prescription medicines only as told by your  health care provider. General information  Manage your risk factors for high cholesterol. Talk with your health care provider about all your risk factors and how to lower your risk.  Manage other conditions that you have, such as diabetes or high blood pressure (hypertension).  Have blood tests to check your cholesterol levels at regular points in time as told by your health care provider.  Keep all follow-up visits as told by your health care provider. This is important. Where to find more information  American Heart Association: www.heart.org  National Heart, Lung, and Blood Institute: www.nhlbi.nih.gov Summary  High cholesterol increases your risk for heart disease and stroke. By keeping your cholesterol level low, you can reduce your risk for these conditions.  High cholesterol can often be prevented with diet and lifestyle changes.  Work with your health care provider to manage your risk factors, and have your blood tested regularly. This information is not intended to replace advice given to you by your health care provider. Make sure you discuss any questions you have with your health care provider. Document Revised: 05/20/2019 Document Reviewed: 05/20/2019 Elsevier Patient Education  2021 Elsevier Inc.  

## 2020-12-02 LAB — IRON,TIBC AND FERRITIN PANEL
%SAT: 37 % (calc) (ref 16–45)
Ferritin: 78 ng/mL (ref 16–288)
Iron: 126 ug/dL (ref 45–160)
TIBC: 338 mcg/dL (calc) (ref 250–450)

## 2020-12-03 DIAGNOSIS — H2511 Age-related nuclear cataract, right eye: Secondary | ICD-10-CM | POA: Diagnosis not present

## 2020-12-08 ENCOUNTER — Telehealth: Payer: Self-pay | Admitting: Gastroenterology

## 2020-12-08 ENCOUNTER — Ambulatory Visit (AMBULATORY_SURGERY_CENTER): Payer: Medicare Other | Admitting: Gastroenterology

## 2020-12-08 ENCOUNTER — Encounter: Payer: Self-pay | Admitting: Gastroenterology

## 2020-12-08 ENCOUNTER — Other Ambulatory Visit: Payer: Self-pay

## 2020-12-08 VITALS — BP 119/53 | HR 69 | Temp 98.2°F | Resp 18 | Ht 65.0 in | Wt 170.0 lb

## 2020-12-08 DIAGNOSIS — K2970 Gastritis, unspecified, without bleeding: Secondary | ICD-10-CM

## 2020-12-08 DIAGNOSIS — K317 Polyp of stomach and duodenum: Secondary | ICD-10-CM

## 2020-12-08 DIAGNOSIS — K3189 Other diseases of stomach and duodenum: Secondary | ICD-10-CM | POA: Diagnosis not present

## 2020-12-08 DIAGNOSIS — D649 Anemia, unspecified: Secondary | ICD-10-CM | POA: Diagnosis not present

## 2020-12-08 DIAGNOSIS — R1013 Epigastric pain: Secondary | ICD-10-CM

## 2020-12-08 MED ORDER — OMEPRAZOLE 20 MG PO CPDR: 20.0000 mg | DELAYED_RELEASE_CAPSULE | Freq: Every day | ORAL | 3 refills | Status: DC

## 2020-12-08 MED ORDER — SODIUM CHLORIDE 0.9 % IV SOLN: 500.0000 mL | INTRAVENOUS | Status: DC

## 2020-12-08 NOTE — Patient Instructions (Signed)
Thank you for allowing [place] to care for you today.  Await final pathology results by mail, approximately 2 weeks.  Resume previous diet and mediations today.  Resume Eliquis at previous dose tomorrow, 12/09/2020.  Avoid NSAIDS (Ibuprofen, Aspirin, Naproxen)  Use Prilosec (Omeprazole)  20 mg daily for 2 weeks ( 14 days) then discontinue.    Resume normal daily activities tomorrow.   YOU HAD AN ENDOSCOPIC PROCEDURE TODAY AT [facility]:   Refer to the procedure report that was given to you for any specific questions about what was found during the examination.  If the procedure report does not answer your questions, please call your gastroenterologist to clarify.  If you requested that your care partner not be given the details of your procedure findings, then the procedure report has been included in a sealed envelope for you to review at your convenience later.  YOU SHOULD EXPECT: Some feelings of bloating in the abdomen. Passage of more gas than usual.  Walking can help get rid of the air that was put into your GI tract during the procedure and reduce the bloating. If you had a lower endoscopy (such as a colonoscopy or flexible sigmoidoscopy) you may notice spotting of blood in your stool or on the toilet paper. If you underwent a bowel prep for your procedure, you may not have a normal bowel movement for a few days.  Please Note:  You might notice some irritation and congestion in your nose or some drainage.  This is from the oxygen used during your procedure.  There is no need for concern and it should clear up in a day or so.  SYMPTOMS TO REPORT IMMEDIATELY:    Following upper endoscopy (EGD)  Vomiting of blood or coffee ground material  New chest pain or pain under the shoulder blades  Painful or persistently difficult swallowing  New shortness of breath  Fever of 100F or higher  Black, tarry-looking stools  For urgent or emergent issues, a gastroenterologist can  be reached at any hour by calling [phone]. Do not use MyChart messaging for urgent concerns.    DIET:  We do recommend a small meal at first, but then you may proceed to your regular diet.  Drink plenty of fluids but you should avoid alcoholic beverages for 24 hours.  ACTIVITY:  You should plan to take it easy for the rest of today and you should NOT DRIVE or use heavy machinery until tomorrow (because of the sedation medicines used during the test).    FOLLOW UP: Our staff will call the number listed on your records 48-72 hours following your procedure to check on you and address any questions or concerns that you may have regarding the information given to you following your procedure. If we do not reach you, we will leave a message.  We will attempt to reach you two times.  During this call, we will ask if you have developed any symptoms of COVID 19. If you develop any symptoms (ie: fever, flu-like symptoms, shortness of breath, cough etc.) before then, please call [phone].  If you test positive for Covid 19 in the 2 weeks post procedure, please call and report this information to [place].    If any biopsies were taken you will be contacted by phone or by letter within the next 1-3 weeks.  Please call us at [phone] if you have not heard about the biopsies in 3 weeks.    SIGNATURES/CONFIDENTIALITY: You and/or  your care partner have signed paperwork which will be entered into your electronic medical record.  These signatures attest to the fact that that the information above on your After Visit Summary has been reviewed and is understood.  Full responsibility of the confidentiality of this discharge information lies with you and/or your care-partner.

## 2020-12-08 NOTE — Telephone Encounter (Signed)
The order  is Omeprazole 20 mg x 14 days, then may discontinue.

## 2020-12-08 NOTE — Op Note (Signed)
[facility] Patient Name: Shelia Roth Procedure Date: 12/08/2020 10:23 AM MRN: [id number] Endoscopist: Gerrit Heck , MD Age: 75 Referring MD:  Date of Birth: 31-Jul-1946 Gender: Female Account #: [id number] Procedure:                Upper GI endoscopy Indications:              Epigastric abdominal pain, Mild microcytic anemia Medicines:                Monitored Anesthesia Care Procedure:                Pre-Anesthesia Assessment:                           - Prior to the procedure, a History and Physical                            was performed, and patient medications and                            allergies were reviewed. The patient's tolerance of                            previous anesthesia was also reviewed. The risks                            and benefits of the procedure and the sedation                            options and risks were discussed with the patient.                            All questions were answered, and informed consent                            was obtained. Prior Anticoagulants: The patient has                            taken Eliquis (apixaban), last dose was 3 days                            prior to procedure. ASA Grade Assessment: III - A                            patient with severe systemic disease. After                            reviewing the risks and benefits, the patient was                            deemed in satisfactory condition to undergo the                            procedure.  After obtaining informed consent, the endoscope was                            passed under direct vision. Throughout the                            procedure, the patient's blood pressure, pulse, and                            oxygen saturations were monitored continuously. The                            Endoscope was introduced through the mouth, and                            advanced to the second part of duodenum.  The upper                            GI endoscopy was accomplished without difficulty.                            The patient tolerated the procedure well. Scope In: Scope Out: Findings:                 The examined esophagus was normal.                           The gastroesophageal flap valve was visualized                            endoscopically and classified as Hill Grade III                            (minimal fold, loose to endoscope, hiatal hernia                            likely).                           A single 3 mm sessile polyp with no bleeding was                            found in the gastric body. The polyp was removed                            with a cold biopsy forceps. Resection and retrieval                            were complete. Estimated blood loss was minimal.                           Localized mild inflammation characterized by                            erythema and a  single healing ulcer was found in                            the gastric antrum. Biopsies were taken with a cold                            forceps for histology. Estimated blood loss was                            minimal.                           The examined duodenum was normal. Biopsies were                            taken with a cold forceps for histology. Estimated                            blood loss was minimal. Complications:            No immediate complications. Estimated Blood Loss:     Estimated blood loss was minimal. Impression:               - Normal esophagus.                           - Gastroesophageal flap valve classified as Hill                            Grade III (minimal fold, loose to endoscope, hiatal                            hernia likely).                           - A single gastric polyp. Resected and retrieved.                           - Gastritis. Biopsied.                           - Normal examined duodenum. Biopsied. Recommendation:           -  Patient has a contact number available for                            emergencies. The signs and symptoms of potential                            delayed complications were discussed with the                            patient. Return to normal activities tomorrow.                            Written discharge instructions were provided to the  patient.                           - Resume previous diet.                           - Continue present medications.                           - Use Prilosec (omeprazole) 20 mg PO BID for 6                            weeks to promote mucosal healing, then reduce to 20                            mg daily for 2 weeks, then can discontinue                            completely.                           - Resume Eliquis (apixaban) at prior dose tomorrow.                           - No NSAIDs.                           - Await pathology results.                           - Return to GI clinic PRN. Gerrit Heck, MD 12/08/2020 10:49:47 AM

## 2020-12-08 NOTE — Progress Notes (Signed)
A/ox3, pleased with MAC, report to RN 

## 2020-12-08 NOTE — Telephone Encounter (Signed)
Please clarify the omeprazole order that was placed this morning, I am showing only for 14 days, I just want to be positive before I call the pharmacy back to change the amount

## 2020-12-08 NOTE — Telephone Encounter (Signed)
Inbound call from [facility] in [place] Omeprazole 20mg  regarding the directions. States there is two different directions that can be used and wanted to know which would recommend. Best contact number [phone]

## 2020-12-08 NOTE — Progress Notes (Signed)
Spoke with patient who verbally understood labs and recommendations. Per patient she would be willing to take cholesterol medication. She would like this sent to [facility] in [place], [place]

## 2020-12-08 NOTE — Progress Notes (Signed)
VS by CW  I have reviewed the patient's medical history in detail and updated the computerized patient record.  

## 2020-12-08 NOTE — Progress Notes (Signed)
Called to room to assist during endoscopic procedure.  Patient ID and intended procedure confirmed with present staff. Received instructions for my participation in the procedure from the performing physician.  

## 2020-12-08 NOTE — Telephone Encounter (Signed)
Spoke with [facility] at [place] and changed the rx to omeprazole 20mg  x14 no refils

## 2020-12-10 ENCOUNTER — Telehealth: Payer: Self-pay | Admitting: Gastroenterology

## 2020-12-10 NOTE — Telephone Encounter (Signed)
Went over directions given by Raquel Sarna, RN for omeprazole 20 mg x14 days. Patient verbalized understanding and will call back in around 2 1/2 weeks to let me know how she is doing.

## 2020-12-13 MED ORDER — ATORVASTATIN CALCIUM 10 MG PO TABS: 10.0000 mg | ORAL_TABLET | Freq: Every day | ORAL | 3 refills | Status: DC

## 2020-12-13 NOTE — Addendum Note (Signed)
Addended by: Jon Billings on: 12/13/2020 04:58 PM   Modules accepted: Orders

## 2020-12-15 ENCOUNTER — Telehealth: Payer: Self-pay

## 2020-12-15 NOTE — Progress Notes (Signed)
Chronic Care Management Pharmacy Assistant   Name: Shelia Roth  MRN: [id number] DOB: 1946/08/17  Reason for Encounter: Medication Review/Medication Coordination call.   Recent office visits:  12/01/2020 Dr.Kremer MD (PCP) started Atorvastatin 10 mg one tablet daily.  Recent consult visits:  12/08/2020 Luanna Salk Cirigliano DO Gertie Fey) Started omeprazole 20 mg capsule -Take once a day for 14 days, then discontinue  Hospital visits:  None in previous 6 months  Medications: Outpatient Encounter Medications as of 12/15/2020  Medication Sig Note  . alendronate (FOSAMAX) 70 MG tablet TAKE ONE TABLET BY MOUTH ONCE WEEKLY BEFORE BREAKFAST ON THURSDAYS   . amLODipine (NORVASC) 5 MG tablet TAKE ONE TABLET BY MOUTH EVERY MORNING   . atorvastatin (LIPITOR) 10 MG tablet Take 1 tablet (10 mg total) by mouth daily.   Marland Kitchen BESIVANCE 0.6 % SUSP Place 1 drop into the right eye 3 (three) times daily.   . Calcium Carb-Cholecalciferol (CALCIUM + VITAMIN D3 PO) Take 1 tablet by mouth 2 (two) times daily. Calcium 600mg  and Vitamin d3 48mcg   . carvedilol (COREG) 12.5 MG tablet TAKE ONE TABLET BY MOUTH AT BREAKFAST AND AT BEDTIME   . diclofenac Sodium (VOLTAREN) 1 % GEL Apply 2 g topically 4 (four) times daily as needed.   . dorzolamide-timolol (COSOPT) 22.3-6.8 MG/ML ophthalmic solution 1 drop 2 (two) times daily.   Marland Kitchen ELIQUIS 5 MG TABS tablet TAKE ONE TABLET BY MOUTH EVERY MORNING and TAKE ONE TABLET BY MOUTH EVERYDAY AT BEDTIME   . flecainide (TAMBOCOR) 50 MG tablet Take 50 mg by mouth 2 (two) times daily. 08/19/2020: [facility]  . losartan (COZAAR) 100 MG tablet TAKE ONE TABLET BY MOUTH EVERY MORNING   . mometasone (NASONEX) 50 MCG/ACT nasal spray Place 2 sprays into the nose daily.   . Multiple Vitamins-Minerals (EMERGEN-C IMMUNE PLUS) PACK Take 1 Package by mouth daily.   Marland Kitchen omeprazole (PRILOSEC) 20 MG capsule Take 1 capsule (20 mg total) by mouth daily.    No  facility-administered encounter medications on file as of 12/15/2020.   Star Rating Drugs: Atorvastatin 10 mg not in dispense history. Losartan 100 mg last filled on 11/20/2020 [facility].  Reviewed chart for medication changes ahead of medication coordination call.   BP Readings from Last 3 Encounters:  12/08/20 (!) 119/53  12/01/20 110/68  10/05/20 (!) 120/50    No results found for: HGBA1C   Patient obtains medications through Adherence Packaging  30 Days   Last adherence delivery included:   Alendronate (FosaMax) 70 Mg Tablet Weekly on Thursdays -Before Breakfast  Amlodipine 5 MG tablet Daily -Breakfast  Eliquis 5 MG Tablet Twice daily- Breakfast and bedtime  Carvedilol 12.5mg  Tablet Twice daily- Breakfast and bedtime  Losartan 100 mg Tablet - Breakfast  Mometasone (Nasonex) 50 MCG/ACT Nasal spray - 2 sprays daily  Flecainide 50 mg tablet twice daily -Breakfast and bedtime   Patient declined medication last month  Voltaren 1% Gel PRN- (adequate Supply)  Calcium + Vit D (Adequate Supply - OTC)  Emergen-C Immune Plus (Adequate Supply - OTC)   Ocuvite-Luteine Multivitamin (Adequate Supply - OTC)  Patient is due for next adherence delivery on: 12/24/2020. Called patient and reviewed medications and coordinated delivery.  This delivery to include:  Alendronate (FosaMax) 70 Mg Tablet Weekly on Thursdays -Before Breakfast  Amlodipine 5 MG tablet Daily -Breakfast  Eliquis 5 MG Tablet Twice daily- Breakfast and bedtime  Carvedilol 12.5mg  Tablet Twice daily- Breakfast  and bedtime  Losartan 100 mg Tablet - Breakfast  Flecainide 50 mg tablet twice daily -Breakfast and bedtime  Atorvastatin 10 mg one tablet daily- before breakfast   Patient declined the following medications:  Mometasone (Nasonex) 50 MCG/ACT Nasal spray - 2 sprays daily (adequate supply)  Omeprazole  20 mg capsule -Take once a day for 14 days -(patient filled  at [facility] for 14 day supply on 12/08/2020)  Voltaren 1% Gel PRN- (adequate Supply)  Calcium + Vit D (Adequate Supply - OTC)  Emergen-C Immune Plus (Adequate Supply - OTC)   Ocuvite-Luteine Multivitamin (Adequate Supply - OTC)  Patient needs refills for None ID.  Confirmed delivery date of 12/24/2020, advised patient that pharmacy will contact them the morning of delivery.  [facility] Pharmacist Assistant [phone]

## 2020-12-23 ENCOUNTER — Encounter: Payer: Self-pay | Admitting: Gastroenterology

## 2021-01-03 DIAGNOSIS — Z961 Presence of intraocular lens: Secondary | ICD-10-CM | POA: Diagnosis not present

## 2021-01-04 ENCOUNTER — Other Ambulatory Visit: Payer: Self-pay | Admitting: Family Medicine

## 2021-01-04 DIAGNOSIS — M8000XS Age-related osteoporosis with current pathological fracture, unspecified site, sequela: Secondary | ICD-10-CM

## 2021-01-06 ENCOUNTER — Other Ambulatory Visit: Payer: Self-pay | Admitting: Family

## 2021-01-06 DIAGNOSIS — I480 Paroxysmal atrial fibrillation: Secondary | ICD-10-CM

## 2021-01-10 DIAGNOSIS — H40053 Ocular hypertension, bilateral: Secondary | ICD-10-CM | POA: Diagnosis not present

## 2021-01-18 ENCOUNTER — Telehealth: Payer: Self-pay

## 2021-01-18 DIAGNOSIS — I480 Paroxysmal atrial fibrillation: Secondary | ICD-10-CM

## 2021-01-18 NOTE — Progress Notes (Addendum)
Chronic Care Management Pharmacy Assistant   Name: Shelia Roth  MRN: [id number] DOB: 19-Jul-1946   Reason for Encounter: Medication Review/Medication Coordination Call.   Recent office visits:  No recent Office Visit  Recent consult visits:  No recent [facility] visits:  None in previous 6 months  Medications: Outpatient Encounter Medications as of 01/18/2021  Medication Sig Note  . alendronate (FOSAMAX) 70 MG tablet TAKE ONE TABLET BY MOUTH ONCE WEEKLY BEFORE BREAKFAST ON THURSDAYS   . amLODipine (NORVASC) 5 MG tablet TAKE ONE TABLET BY MOUTH EVERY MORNING   . atorvastatin (LIPITOR) 10 MG tablet Take 1 tablet (10 mg total) by mouth daily.   Marland Kitchen BESIVANCE 0.6 % SUSP Place 1 drop into the right eye 3 (three) times daily.   . Calcium Carb-Cholecalciferol (CALCIUM + VITAMIN D3 PO) Take 1 tablet by mouth 2 (two) times daily. Calcium 600mg  and Vitamin d3 54mcg   . carvedilol (COREG) 12.5 MG tablet TAKE ONE TABLET BY MOUTH AT BREAKFAST AND AT BEDTIME   . diclofenac Sodium (VOLTAREN) 1 % GEL Apply 2 g topically 4 (four) times daily as needed.   . dorzolamide-timolol (COSOPT) 22.3-6.8 MG/ML ophthalmic solution 1 drop 2 (two) times daily.   Marland Kitchen ELIQUIS 5 MG TABS tablet TAKE ONE TABLET BY MOUTH EVERY MORNING and TAKE ONE TABLET BY MOUTH EVERYDAY AT BEDTIME   . flecainide (TAMBOCOR) 50 MG tablet Take 50 mg by mouth 2 (two) times daily. 08/19/2020: Prescribed by [facility] Bishop Limbo [facility])  . losartan (COZAAR) 100 MG tablet TAKE ONE TABLET BY MOUTH EVERY MORNING   . mometasone (NASONEX) 50 MCG/ACT nasal spray Place 2 sprays into the nose daily.   . Multiple Vitamins-Minerals (EMERGEN-C IMMUNE PLUS) PACK Take 1 Package by mouth daily.   Marland Kitchen omeprazole (PRILOSEC) 20 MG capsule Take 1 capsule (20 mg total) by mouth daily.    No facility-administered encounter medications on file as of 01/18/2021.    Star Rating Drugs: Atorvastatin 10 mg last filled on 12/17/2020 [facility]. Losartan 100 mg last filled on 12/17/2020 [facility] day [facility].  Reviewed chart for medication changes ahead of medication coordination call.    BP Readings from Last 3 Encounters:  12/08/20 (!) 119/53  12/01/20 110/68  10/05/20 (!) 120/50    No results found for: HGBA1C   Patient obtains medications through Adherence Packaging  30 Days   Last adherence delivery included:   Alendronate (FosaMax) 70 Mg Tablet Weekly on Thursdays -Before Breakfast  Amlodipine 5 MG tablet Daily -Breakfast  Eliquis 5 MG Tablet Twice daily- Breakfast and bedtime  Carvedilol 12.5mg  Tablet Twice daily- Breakfast and bedtime  Losartan 100 mg Tablet - Breakfast  Flecainide 50 mg tablet twice daily -Breakfast and bedtime  Atorvastatin 10 mg one tablet daily- before breakfast   Patient declined medications last month  Mometasone (Nasonex) 50 MCG/ACT Nasal spray - 2 sprays daily (adequate supply)  Omeprazole  20 mg capsule -Take once a day for 14 days -(patient filled at [facility] for 14 day supply on 12/08/2020)  Voltaren 1% Gel PRN- (adequate Supply)  Calcium + Vit D (Adequate Supply - OTC)  Emergen-C Immune Plus (Adequate Supply - OTC)   Ocuvite-Luteine Multivitamin (Adequate Supply - OTC)  Patient is due for next adherence delivery on: 01/25/2021 . Called patient and reviewed medications and coordinated delivery.  This delivery to include:  Alendronate (FosaMax) 70 Mg Tablet Weekly on Thursdays -Before Breakfast  Amlodipine 5 MG tablet Daily -Breakfast  Eliquis 5 MG Tablet Twice daily- Breakfast and bedtime  Carvedilol 12.5mg  Tablet Twice daily- Breakfast and bedtime  Losartan 100 mg Tablet - Breakfast  Flecainide 50 mg tablet twice daily -Breakfast and bedtime  Atorvastatin 10 mg one tablet daily- before breakfast  Mometasone (Nasonex) 50 MCG/ACT Nasal spray - 2 sprays daily  Patient declined the following  medications  Omeprazole  20 mg capsule -Take once a day for 14 days -(patient filled at [facility] for 14 day supply on 12/08/2020)  Voltaren 1% Gel PRN- (adequate Supply)  Calcium + Vit D (Adequate Supply - OTC)  Emergen-C Immune Plus (Adequate Supply - OTC)   Ocuvite-Luteine Multivitamin (Adequate Supply - OTC)  Patient needs refills for Eliquis 5 MG Tablet Twice daily .  Confirmed delivery date of 01/25/2021, advised patient that pharmacy will contact them the morning of delivery.  Next office visit with PCP is schedule on 06/02/2021  Patient is schedule for a telephone follow up on 06/21/2021 at 11:00 am.   Blood pressure readings: 124/71,114/65.  [place] Pharmacist Assistant [phone]

## 2021-01-19 MED ORDER — ELIQUIS 5 MG PO TABS: 5.0000 mg | ORAL_TABLET | Freq: Two times a day (BID) | ORAL | 2 refills | Status: DC

## 2021-01-19 NOTE — Addendum Note (Signed)
Addended by: Daron Offer A on: 01/19/2021 11:26 AM   Modules accepted: Orders

## 2021-01-20 ENCOUNTER — Other Ambulatory Visit: Payer: Self-pay | Admitting: Family Medicine

## 2021-01-20 DIAGNOSIS — R0982 Postnasal drip: Secondary | ICD-10-CM

## 2021-02-15 ENCOUNTER — Telehealth: Payer: Self-pay

## 2021-02-15 NOTE — Progress Notes (Signed)
Chronic Care Management Pharmacy Assistant   Name: Shelia Roth  MRN: [id number] DOB: [birth year]  Reason for Encounter: Medication Review/ Medication Coordination call.    Recent office visits:  No recent Office Visit  Recent consult visits:  No recent [facility] visits:  None in previous 6 months  Medications: Outpatient Encounter Medications as of 02/15/2021  Medication Sig Note   alendronate (FOSAMAX) 70 MG tablet TAKE ONE TABLET BY MOUTH ONCE WEEKLY BEFORE BREAKFAST ON THURSDAYS    amLODipine (NORVASC) 5 MG tablet TAKE ONE TABLET BY MOUTH EVERY MORNING    apixaban (ELIQUIS) 5 MG TABS tablet Take 1 tablet (5 mg total) by mouth 2 (two) times daily.    atorvastatin (LIPITOR) 10 MG tablet Take 1 tablet (10 mg total) by mouth daily.    BESIVANCE 0.6 % SUSP Place 1 drop into the right eye 3 (three) times daily.    Calcium Carb-Cholecalciferol (CALCIUM + VITAMIN D3 PO) Take 1 tablet by mouth 2 (two) times daily. Calcium 600mg  and Vitamin d3 60mcg    carvedilol (COREG) 12.5 MG tablet TAKE ONE TABLET BY MOUTH AT BREAKFAST AND AT BEDTIME    diclofenac Sodium (VOLTAREN) 1 % GEL Apply 2 g topically 4 (four) times daily as needed.    dorzolamide-timolol (COSOPT) 22.3-6.8 MG/ML ophthalmic solution 1 drop 2 (two) times daily.    flecainide (TAMBOCOR) 50 MG tablet Take 50 mg by mouth 2 (two) times daily. 08/19/2020: Prescribed by Dr. Bishop Limbo [facility])   losartan (COZAAR) 100 MG tablet TAKE ONE TABLET BY MOUTH EVERY MORNING    mometasone (NASONEX) 50 MCG/ACT nasal spray USE TWO SPRAYS IN EACH NOSTRIL DAILY    Multiple Vitamins-Minerals (EMERGEN-C IMMUNE PLUS) PACK Take 1 Package by mouth daily.    omeprazole (PRILOSEC) 20 MG capsule Take 1 capsule (20 mg total) by mouth daily.    No facility-administered encounter medications on file as of 02/15/2021.    Care Gaps: Annual Wellness Visit Star Rating Drugs: Atorvastatin 10 mg last filled on 01/20/2021 [facility]. Losartan 100 mg last filled on 01/20/2021 [facility]  Reviewed chart for medication changes ahead of medication coordination call.  BP Readings from Last 3 Encounters:  12/08/20 (!) 119/53  12/01/20 110/68  10/05/20 (!) 120/50    No results found for: HGBA1C   Patient obtains medications through Adherence Packaging  30 Days   Last adherence delivery included:  Alendronate (FosaMax) 70 Mg Tablet Weekly on Thursdays - Before Breakfast Amlodipine 5 MG tablet Daily -Breakfast Eliquis 5 MG Tablet Twice daily- Breakfast and bedtime Carvedilol 12.5mg  Tablet Twice daily- Breakfast and bedtime Losartan 100 mg Tablet - Breakfast Flecainide 50 mg tablet twice daily - Breakfast and bedtime Atorvastatin 10 mg one tablet daily- before breakfast Mometasone (Nasonex) 50 MCG/ACT Nasal spray - 2 sprays daily  Patient declined Medications last month  Omeprazole  20 mg capsule -Take once a day for 14 days -(patient filled at [place] [facility] for 14 day supply on 12/08/2020) Voltaren 1% Gel PRN- (adequate Supply) Calcium + Vit D (Adequate Supply - OTC) Emergen-C Immune Plus (Adequate Supply - OTC) Ocuvite-Luteine Multivitamin (Adequate Supply - OTC)  Patient is due for next adherence delivery on: 02/24/2021. Called patient and reviewed medications and coordinated delivery.  This delivery to include: Alendronate (FosaMax) 70 Mg Tablet Weekly on Thursdays - Before Breakfast Amlodipine 5 MG tablet Daily -Breakfast Eliquis 5 MG Tablet Twice daily- Breakfast and  bedtime Carvedilol 12.5mg  Tablet Twice daily- Breakfast and bedtime Losartan 100 mg Tablet - Breakfast Flecainide 50 mg tablet twice daily - Breakfast and bedtime Atorvastatin 10 mg one tablet daily- before breakfast Mometasone (Nasonex) 50 MCG/ACT Nasal spray - 2 sprays daily  Patient declined the following medications: Omeprazole  20 mg capsule -Take once a day for 14 days -(Not  taking) Voltaren 1% Gel PRN- (adequate Supply) Calcium + Vit D (Adequate Supply - OTC) Emergen-C Immune Plus (Adequate Supply - OTC) Ocuvite-Luteine Multivitamin (Adequate Supply - OTC) Patient needs refills for None ID.  Confirmed delivery date of 02/24/2021, advised patient that pharmacy will contact them the morning of delivery.  Patient schedule for annual wellness visit on 05/22/2021 at her PCP Office. Patient schedule for follow up with her PCP on 06/02/2021. Patient schedule for telephone follow up with the clinical pharmacist at 10:00 am on 06/21/2021.  [place] Pharmacist Assistant [phone]

## 2021-03-15 ENCOUNTER — Telehealth: Payer: Self-pay

## 2021-03-15 NOTE — Progress Notes (Signed)
Chronic Care Management Pharmacy Assistant   Name: Shelia Roth  MRN: [id number] DOB: 07-19-1946  Reason for Encounter: Medication Review/Medication Coordination Call.   Recent office visits:  No recent Office Visit  Recent consult visits:  No consult Visit  Hospital visits:  None in previous 6 months  Medications: Outpatient Encounter Medications as of 03/15/2021  Medication Sig Note   alendronate (FOSAMAX) 70 MG tablet TAKE ONE TABLET BY MOUTH ONCE WEEKLY BEFORE BREAKFAST ON THURSDAYS    amLODipine (NORVASC) 5 MG tablet TAKE ONE TABLET BY MOUTH EVERY MORNING    apixaban (ELIQUIS) 5 MG TABS tablet Take 1 tablet (5 mg total) by mouth 2 (two) times daily.    atorvastatin (LIPITOR) 10 MG tablet Take 1 tablet (10 mg total) by mouth daily.    BESIVANCE 0.6 % SUSP Place 1 drop into the right eye 3 (three) times daily.    Calcium Carb-Cholecalciferol (CALCIUM + VITAMIN D3 PO) Take 1 tablet by mouth 2 (two) times daily. Calcium '600mg'$  and Vitamin d3 21mg    carvedilol (COREG) 12.5 MG tablet TAKE ONE TABLET BY MOUTH AT BREAKFAST AND AT BEDTIME    diclofenac Sodium (VOLTAREN) 1 % GEL Apply 2 g topically 4 (four) times daily as needed.    dorzolamide-timolol (COSOPT) 22.3-6.8 MG/ML ophthalmic solution 1 drop 2 (two) times daily.    flecainide (TAMBOCOR) 50 MG tablet Take 50 mg by mouth 2 (two) times daily. 08/19/2020: [facility]   losartan (COZAAR) 100 MG tablet TAKE ONE TABLET BY MOUTH EVERY MORNING    mometasone (NASONEX) 50 MCG/ACT nasal spray USE TWO SPRAYS IN EACH NOSTRIL DAILY    Multiple Vitamins-Minerals (EMERGEN-C IMMUNE PLUS) PACK Take 1 Package by mouth daily.    omeprazole (PRILOSEC) 20 MG capsule Take 1 capsule (20 mg total) by mouth daily.    No facility-administered encounter medications on file as of 03/15/2021.    Care Gaps: Hepatitis C Screening Tetanus Vaccine Shingrix Vaccine COVID-19 Vaccine  Star Rating  Drugs: Atorvastatin 10 mg last filled on 02/18/2021 [facility]. Losartan 100 mg last filled on 02/18/2021 [facility] Medication Fill Gaps: N/A  Reviewed chart for medication changes ahead of medication coordination call.   BP Readings from Last 3 Encounters:  12/08/20 (!) 119/53  12/01/20 110/68  10/05/20 (!) 120/50    No results found for: HGBA1C   Patient obtains medications through Adherence Packaging  30 Days   Last adherence delivery included:  Alendronate (FosaMax) 70 Mg Tablet Weekly on Thursdays - Before Breakfast Amlodipine 5 MG tablet Daily -Breakfast Eliquis 5 MG Tablet Twice daily- Breakfast and bedtime Carvedilol 12.'5mg'$  Tablet Twice daily- Breakfast and bedtime Losartan 100 mg Tablet - Breakfast Flecainide 50 mg tablet twice daily - Breakfast and bedtime Atorvastatin 10 mg one tablet daily- before breakfast Mometasone (Nasonex) 50 MCG/ACT Nasal spray - 2 sprays daily  Patient declined  last month: Omeprazole  20 mg capsule -Take once a day for 14 days -(Not taking) Voltaren 1% Gel PRN- (adequate Supply) Calcium + Vit D (Adequate Supply - OTC) Emergen-C Immune Plus (Adequate Supply - OTC) Ocuvite-Luteine Multivitamin (Adequate Supply - OTC)  Patient is due for next adherence delivery on: 03/24/2021. Called patient and reviewed medications and coordinated delivery.  This delivery to include: Alendronate (FosaMax) 70 Mg Tablet Weekly on Thursdays - Before Breakfast Amlodipine 5 MG tablet Daily -Breakfast Eliquis 5 MG Tablet Twice daily- Breakfast and bedtime  Carvedilol 12.'5mg'$  Tablet Twice daily- Breakfast and bedtime Losartan 100 mg Tablet - Breakfast Flecainide 50 mg tablet twice daily - Breakfast and bedtime Atorvastatin 10 mg one tablet daily- before breakfast   Patient declined the following medications: Mometasone (Nasonex) 50 MCG/ACT Nasal spray - 2 sprays daily (Adequate supply) Omeprazole  20  mg capsule -Take once a day for 14 days -(Not taking) Voltaren 1% Gel PRN- (adequate Supply) Calcium + Vit D (Adequate Supply - OTC) Emergen-C Immune Plus (Adequate Supply - OTC) Ocuvite-Luteine Multivitamin (Adequate Supply - OTC)  Patient needs refills for None ID .  Confirmed delivery date of 03/24/2021, advised patient that pharmacy will contact them the morning of delivery.  [place] Pharmacist Assistant [phone]

## 2021-04-05 ENCOUNTER — Other Ambulatory Visit: Payer: Self-pay | Admitting: Family Medicine

## 2021-04-05 DIAGNOSIS — M8000XS Age-related osteoporosis with current pathological fracture, unspecified site, sequela: Secondary | ICD-10-CM

## 2021-04-14 ENCOUNTER — Telehealth: Payer: Self-pay

## 2021-04-14 NOTE — Progress Notes (Signed)
Chronic Care Management Pharmacy Assistant   Name: Shelia Roth  MRN: [id number] DOB: [birth year]  Reason for Encounter: Medication Review/Medication Coordination Call.   Recent office visits:  No recent Office Visit  Recent consult visits:  No recent [facility] visits:  None in previous 6 months  Medications: Outpatient Encounter Medications as of 04/14/2021  Medication Sig Note   alendronate (FOSAMAX) 70 MG tablet TAKE ONE TABLET BY MOUTH ONCE WEEKLY BEFORE BREAKFAST ON THURSDAYS    amLODipine (NORVASC) 5 MG tablet TAKE ONE TABLET BY MOUTH EVERY MORNING    apixaban (ELIQUIS) 5 MG TABS tablet Take 1 tablet (5 mg total) by mouth 2 (two) times daily.    atorvastatin (LIPITOR) 10 MG tablet Take 1 tablet (10 mg total) by mouth daily.    BESIVANCE 0.6 % SUSP Place 1 drop into the right eye 3 (three) times daily.    Calcium Carb-Cholecalciferol (CALCIUM + VITAMIN D3 PO) Take 1 tablet by mouth 2 (two) times daily. Calcium '600mg'$  and Vitamin d3 58mg    carvedilol (COREG) 12.5 MG tablet TAKE ONE TABLET BY MOUTH AT BREAKFAST AND AT BEDTIME    diclofenac Sodium (VOLTAREN) 1 % GEL Apply 2 g topically 4 (four) times daily as needed.    dorzolamide-timolol (COSOPT) 22.3-6.8 MG/ML ophthalmic solution 1 drop 2 (two) times daily.    flecainide (TAMBOCOR) 50 MG tablet Take 50 mg by mouth 2 (two) times daily. 08/19/2020: [facility] KB[facility]   losartan (COZAAR) 100 MG tablet TAKE ONE TABLET BY MOUTH EVERY MORNING    mometasone (NASONEX) 50 MCG/ACT nasal spray USE TWO SPRAYS IN EACH NOSTRIL DAILY    Multiple Vitamins-Minerals (EMERGEN-C IMMUNE PLUS) PACK Take 1 Package by mouth daily.    omeprazole (PRILOSEC) 20 MG capsule Take 1 capsule (20 mg total) by mouth daily.    No facility-administered encounter medications on file as of 04/14/2021.    Care Gaps: Hepatitis C Screening Tetanus Vaccine Shingrix Vaccine COVID-19 Vaccine  Influenza Vaccine Star  Rating Drugs: Atorvastatin 10 mg last filled on 03/18/2021 [facility]. Losartan 100 mg last filled on 03/18/2021 [facility] Medication Fill Gaps: N/A  Reviewed chart for medication changes ahead of medication coordination call.    BP Readings from Last 3 Encounters:  12/08/20 (!) 119/53  12/01/20 110/68  10/05/20 (!) 120/50    No results found for: HGBA1C   Patient obtains medications through Adherence Packaging  30 Days   Last adherence delivery included:  Alendronate (FosaMax) 70 Mg Tablet Weekly on Thursdays - Before Breakfast Amlodipine 5 MG tablet Daily -Breakfast Eliquis 5 MG Tablet Twice daily- Breakfast and bedtime Carvedilol 12.'5mg'$  Tablet Twice daily- Breakfast and bedtime Losartan 100 mg Tablet - Breakfast Flecainide 50 mg tablet twice daily - Breakfast and bedtime Atorvastatin 10 mg one tablet daily- before breakfast  Patient declined medications last month  Mometasone (Nasonex) 50 MCG/ACT Nasal spray - 2 sprays daily (Adequate supply) Omeprazole  20 mg capsule -Take once a day for 14 days -(Not taking) Voltaren 1% Gel PRN- (adequate Supply) Calcium + Vit D (Adequate Supply - OTC) Emergen-C Immune Plus (Adequate Supply - OTC) Ocuvite-Luteine Multivitamin (Adequate Supply - OTC)  Patient is due for next adherence delivery on: 04/25/2021. Called patient and reviewed medications and coordinated delivery.  This delivery to include: Alendronate (FosaMax) 70 Mg Tablet Weekly on Thursdays - Before Breakfast Amlodipine 5 MG tablet Daily -Breakfast Eliquis 5  MG Tablet Twice daily- Breakfast and bedtime Carvedilol 12.'5mg'$  Tablet Twice daily- Breakfast and bedtime Losartan 100 mg Tablet - Breakfast Flecainide 50 mg tablet twice daily - Breakfast and bedtime Atorvastatin 10 mg one tablet daily- before breakfast   Patient declined the following medications  Mometasone (Nasonex) 50 MCG/ACT Nasal spray - 2 sprays  daily (Adequate supply) Omeprazole  20 mg capsule -Take once a day for 14 days -(Not taking) Voltaren 1% Gel PRN- (adequate Supply) Calcium + Vit D (Adequate Supply - OTC) Emergen-C Immune Plus (Adequate Supply - OTC) Ocuvite-Luteine Multivitamin (Adequate Supply - OTC)  Patient needs refills for None ID.  Confirmed delivery date of 04/25/2021, advised patient that pharmacy will contact them the morning of delivery.  [place] Pharmacist Assistant [phone]

## 2021-05-02 DIAGNOSIS — Z20822 Contact with and (suspected) exposure to covid-19: Secondary | ICD-10-CM | POA: Diagnosis not present

## 2021-05-09 ENCOUNTER — Other Ambulatory Visit: Payer: Self-pay | Admitting: Family Medicine

## 2021-05-09 DIAGNOSIS — I480 Paroxysmal atrial fibrillation: Secondary | ICD-10-CM

## 2021-05-13 ENCOUNTER — Telehealth: Payer: Self-pay

## 2021-05-13 NOTE — Progress Notes (Addendum)
Chronic Care Management Pharmacy Assistant   Name: Shelia Roth  MRN: [id number] DOB: 1946/06/08  Reason for Encounter: Medication Review/Medication Coordination Call.   Recent office visits:  No recent Office Visit  Recent consult visits:  No recent [facility] visits:  None in previous 6 months  Medications: Outpatient Encounter Medications as of 05/13/2021  Medication Sig Note   alendronate (FOSAMAX) 70 MG tablet TAKE ONE TABLET BY MOUTH ONCE WEEKLY BEFORE BREAKFAST ON THURSDAYS    amLODipine (NORVASC) 5 MG tablet TAKE ONE TABLET BY MOUTH EVERY MORNING    atorvastatin (LIPITOR) 10 MG tablet Take 1 tablet (10 mg total) by mouth daily.    BESIVANCE 0.6 % SUSP Place 1 drop into the right eye 3 (three) times daily.    Calcium Carb-Cholecalciferol (CALCIUM + VITAMIN D3 PO) Take 1 tablet by mouth 2 (two) times daily. Calcium 600mg  and Vitamin d3 23mcg    carvedilol (COREG) 12.5 MG tablet TAKE ONE TABLET BY MOUTH AT BREAKFAST AND AT BEDTIME    diclofenac Sodium (VOLTAREN) 1 % GEL Apply 2 g topically 4 (four) times daily as needed.    dorzolamide-timolol (COSOPT) 22.3-6.8 MG/ML ophthalmic solution 1 drop 2 (two) times daily.    ELIQUIS 5 MG TABS tablet TAKE ONE TABLET BY MOUTH EVERY MORNING and TAKE ONE TABLET BY MOUTH EVERYDAY AT BEDTIME    flecainide (TAMBOCOR) 50 MG tablet Take 50 mg by mouth 2 (two) times daily. 08/19/2020: Prescribed by Dr[facility] Bishop [facility])   losartan (COZAAR) 100 MG tablet TAKE ONE TABLET BY MOUTH EVERY MORNING    mometasone (NASONEX) 50 MCG/ACT nasal spray USE TWO SPRAYS IN EACH NOSTRIL DAILY    Multiple Vitamins-Minerals (EMERGEN-C IMMUNE PLUS) PACK Take 1 Package by mouth daily.    omeprazole (PRILOSEC) 20 MG capsule Take 1 capsule (20 mg total) by mouth daily.    No facility-administered encounter medications on file as of 05/13/2021.    Care Gaps: Hepatitis C Screening Tetanus Vaccine Shingrix Vaccine COVID-19 Vaccine   Influenza Vaccine Star Rating Drugs: Atorvastatin 10 mg last filled on 04/19/2021 [facility]. Losartan 100 mg last filled on 04/19/2021 [facility] Medication Fill Gaps: None  Reviewed chart for medication changes ahead of medication coordination call.   BP Readings from Last 3 Encounters:  12/08/20 (!) 119/53  12/01/20 110/68  10/05/20 (!) 120/50    No results found for: HGBA1C   Patient obtains medications through Adherence Packaging  30 Days   Last adherence delivery included: Alendronate (FosaMax) 70 Mg Tablet Weekly on Thursdays - Before Breakfast Amlodipine 5 MG tablet Daily -Breakfast Eliquis 5 MG Tablet Twice daily- Breakfast and bedtime Carvedilol 12.5mg  Tablet Twice daily- Breakfast and bedtime Losartan 100 mg Tablet - Breakfast Flecainide 50 mg tablet twice daily - Breakfast and bedtime Atorvastatin 10 mg one tablet daily- before breakfast  Patient declined medications last month  Mometasone (Nasonex) 50 MCG/ACT Nasal spray - 2 sprays daily (Adequate supply) Omeprazole  20 mg capsule -Take once a day for 14 days -(Not taking) Voltaren 1% Gel PRN- (adequate Supply) Calcium + Vit D (Adequate Supply - OTC) Emergen-C Immune Plus (Adequate Supply - OTC) Ocuvite-Luteine Multivitamin (Adequate Supply - OTC)  Patient is due for next adherence delivery on: 05/24/2021. Called patient and reviewed medications and coordinated delivery.  This delivery to include: Alendronate (FosaMax) 70 Mg Tablet Weekly on Thursdays - Before Breakfast Amlodipine 5 MG tablet Daily -Breakfast Eliquis  5 MG Tablet Twice daily- Breakfast and bedtime Carvedilol 12.5mg  Tablet Twice daily- Breakfast and bedtime Losartan 100 mg Tablet - Breakfast Flecainide 50 mg tablet twice daily - Breakfast and bedtime Atorvastatin 10 mg one tablet daily- before breakfast   Patient declined the following medications  Mometasone (Nasonex) 50 MCG/ACT  Nasal spray - 2 sprays daily (Adequate supply) Omeprazole  20 mg capsule -Take once a day for 14 days -(Not taking) Voltaren 1% Gel PRN- (adequate Supply) Calcium + Vit D (Adequate Supply - OTC) Emergen-C Immune Plus (Adequate Supply - OTC) Ocuvite-Luteine Multivitamin (Adequate Supply - OTC)  Patient needs refills for None.  Confirmed delivery date of 05/24/2021, advised patient that pharmacy will contact them the morning of delivery.  Telephone follow up appointment with Care management team member scheduled for : 06/21/2021 at 10:00 am .  [place] Pharmacist Assistant [phone]   Addendum 9/26: Omeprazole discontinued from patient list due to patient not taking.

## 2021-05-16 NOTE — Addendum Note (Signed)
Addended by: Daron Offer A on: 05/16/2021 04:25 PM   Modules accepted: Orders

## 2021-05-23 ENCOUNTER — Telehealth: Payer: Self-pay

## 2021-05-23 NOTE — Progress Notes (Signed)
Per Clinical Pharmacist,Please reschedule patient CCM telephone follow up on 06/21/2021.  Patient reschedule her CCM telephone follow up to 06/23/2021 at 3:15 pm.Patient states she would like 3:15 pm appointment instead of 3:00 pm to give her enough time to get home due to picking up her grandkids.  Appointment schedule on 06/23/2021 at 3:15 pm for a CCM telephone follow up with the clinical pharmacist.  [place] Pharmacist Assistant [phone]

## 2021-05-24 ENCOUNTER — Ambulatory Visit (INDEPENDENT_AMBULATORY_CARE_PROVIDER_SITE_OTHER): Payer: Medicare Other | Admitting: *Deleted

## 2021-05-24 DIAGNOSIS — Z780 Asymptomatic menopausal state: Secondary | ICD-10-CM

## 2021-05-24 DIAGNOSIS — Z0000 Encounter for general adult medical examination without abnormal findings: Secondary | ICD-10-CM | POA: Diagnosis not present

## 2021-05-24 NOTE — Progress Notes (Signed)
Subjective:   Shelia Roth is a 75 y.o. female who presents for Medicare Annual (Subsequent) preventive examination.  I connected with  Claude Manges on 05/24/21 by a telephone enabled telemedicine application and verified that I am speaking with the correct person using two identifiers.   I discussed the limitations of evaluation and management by telemedicine. The patient expressed understanding and agreed to proceed.   Review of Systems     Cardiac Risk Factors include: advanced age (>61men, >57 women);hypertension     Objective:    Today's Vitals   There is no height or weight on file to calculate BMI.  Advanced Directives 05/24/2021 05/20/2020 05/14/2019  Does Patient Have a Medical Advance Directive? No No No  Would patient like information on creating a medical advance directive? No - Patient declined Yes (MAU/Ambulatory/Procedural Areas - Information given) No - Patient declined    Current Medications (verified) Outpatient Encounter Medications as of 05/24/2021  Medication Sig   alendronate (FOSAMAX) 70 MG tablet TAKE ONE TABLET BY MOUTH ONCE WEEKLY BEFORE BREAKFAST ON THURSDAYS   amLODipine (NORVASC) 5 MG tablet TAKE ONE TABLET BY MOUTH EVERY MORNING   atorvastatin (LIPITOR) 10 MG tablet Take 1 tablet (10 mg total) by mouth daily.   BESIVANCE 0.6 % SUSP Place 1 drop into the right eye 3 (three) times daily.   Calcium Carb-Cholecalciferol (CALCIUM + VITAMIN D3 PO) Take 1 tablet by mouth 2 (two) times daily. Calcium 600mg  and Vitamin d3 22mcg   carvedilol (COREG) 12.5 MG tablet TAKE ONE TABLET BY MOUTH AT BREAKFAST AND AT BEDTIME   diclofenac Sodium (VOLTAREN) 1 % GEL Apply 2 g topically 4 (four) times daily as needed.   dorzolamide-timolol (COSOPT) 22.3-6.8 MG/ML ophthalmic solution 1 drop 2 (two) times daily.   ELIQUIS 5 MG TABS tablet TAKE ONE TABLET BY MOUTH EVERY MORNING and TAKE ONE TABLET BY MOUTH EVERYDAY AT BEDTIME   flecainide (TAMBOCOR) 50 MG tablet Take 50 mg  by mouth 2 (two) times daily.   losartan (COZAAR) 100 MG tablet TAKE ONE TABLET BY MOUTH EVERY MORNING   mometasone (NASONEX) 50 MCG/ACT nasal spray USE TWO SPRAYS IN EACH NOSTRIL DAILY   Multiple Vitamins-Minerals (EMERGEN-C IMMUNE PLUS) PACK Take 1 Package by mouth daily.   No facility-administered encounter medications on file as of 05/24/2021.    Allergies (verified) Codeine   History: Past Medical History:  Diagnosis Date   Asthma    Atrial fibrillation ([place])    Cataract    Chicken pox    Gallstones    Glaucoma    Hyperlipidemia    pt denies, never taken meds   Hypertension    Osteoporosis    Thyroid disease    UTI (urinary tract infection)    Past Surgical History:  Procedure Laterality Date   ABDOMINAL HYSTERECTOMY     [place]   COLONOSCOPY  2011   [facility]   ESOPHAGOGASTRODUODENOSCOPY     right after gallbladder removal    MASTECTOMY Bilateral    for mastitis. Says to do blood pressure on left    PARTIAL HIP ARTHROPLASTY  2017   replacement of head per patient    THYROIDECTOMY  1991   Family History  Problem Relation Age of Onset   COPD Father    Heart disease Father    Hypertension Father    Stomach cancer Father        said that  they autospy didn't state but dr thinks he did have this. Died with internal bleeding and cardiac arrest    Diabetes Brother    Hyperlipidemia Brother    Heart disease Daughter    Stroke Maternal Grandmother    Heart attack Maternal Grandfather    Heart attack Paternal Grandmother    Early death Paternal Grandfather    Colon cancer Neg Hx    Esophageal cancer Neg Hx    Rectal cancer Neg Hx    Social History   Socioeconomic History   Marital status: Divorced    Spouse name: Not on file   Number of children: 1   Years of education: Not on file   Highest education level: Not on file  Occupational History   Occupation: retired  Tobacco Use   Smoking status:  Never   Smokeless tobacco: Never  Vaping Use   Vaping Use: Never used  Substance and Sexual Activity   Alcohol use: No   Drug use: Never   Sexual activity: Never  Other Topics Concern   Not on file  Social History Narrative   Not on file   Social Determinants of Health   Financial Resource Strain: Low Risk    Difficulty of Paying Living Expenses: Not hard at all  Food Insecurity: No Food Insecurity   Worried About Charity fundraiser in the Last Year: Never true   [place] in the Last Year: Never true  Transportation Needs: No Transportation Needs   Lack of Transportation (Medical): No   Lack of Transportation (Non-Medical): No  Physical Activity: Insufficiently Active   Days of Exercise per Week: 2 days   Minutes of Exercise per Session: 30 min  Stress: No Stress Concern Present   Feeling of Stress : Not at all  Social Connections: Moderately Isolated   Frequency of Communication with Friends and Family: More than three times a week   Frequency of Social Gatherings with Friends and Family: More than three times a week   Attends Religious Services: More than 4 times per year   Active Member of [employer] or Organizations: No   Attends Music therapist: Never   Marital Status: Divorced    Tobacco Counseling Counseling given: Not Answered   Clinical Intake:  Pre-visit preparation completed: Yes  Pain : No/denies pain     Nutritional Risks: None Diabetes: No  How often do you need to have someone help you when you read instructions, pamphlets, or other written materials from your doctor or pharmacy?: 1 - Never  Diabetic?no  Interpreter Needed?: No  Information entered by :: Leroy Kennedy LPN   Activities of Daily Living In your present state of health, do you have any difficulty performing the following activities: 05/24/2021  Hearing? N  Vision? N  Difficulty concentrating or making decisions? N  Walking or climbing stairs? N  Dressing or  bathing? N  Doing errands, shopping? N  Preparing Food and eating ? N  Using the Toilet? N  In the past six months, have you accidently leaked urine? N  Do you have problems with loss of bowel control? N  Managing your Medications? N  Managing your Finances? N  Housekeeping or managing your Housekeeping? N  Some recent data might be hidden    Patient Care Team: Libby Maw, MD as PCP - General (Family Medicine) Germaine Pomfret, [facility] as Pharmacist (Pharmacist)  Indicate any recent Medical Services you may have received from other than [facility]  providers in the past year (date may be approximate).     Assessment:   This is a routine wellness examination for Angelynn.  Hearing/Vision screen Hearing Screening - Comments:: No trouble hearing  Vision Screening - Comments:: [facility] 4--2022 cataracts both eyes removed  Dietary issues and exercise activities discussed: Current Exercise Habits: Home exercise routine, Type of exercise: walking, Time (Minutes): 20, Frequency (Times/Week): 4, Weekly Exercise (Minutes/Week): 80, Intensity: Mild   Goals Addressed             This Visit's Progress    Increase physical activity       Patient Stated   On track    Maintain healthy lifestyle        Depression Screen PHQ 2/9 Scores 05/24/2021 12/01/2020 05/20/2020 05/14/2019  PHQ - 2 Score 0 0 1 0    Fall Risk Fall Risk  05/24/2021 12/01/2020 05/20/2020 05/14/2019  Falls in the past year? 0 0 0 0  Number falls in past yr: 0 - 0 -  Injury with Fall? 0 - 0 -  Follow up Falls evaluation completed;Falls prevention discussed - Falls prevention discussed -    FALL RISK PREVENTION PERTAINING TO THE HOME:  Any stairs in or around the home? No  If so, are there any without handrails? No  Home free of loose throw rugs in walkways, pet beds, electrical cords, etc? Yes  Adequate lighting in your home to reduce risk of falls? Yes   ASSISTIVE DEVICES UTILIZED TO PREVENT  FALLS:  Life alert? No  Use of a cane, walker or w/c? No  Grab bars in the bathroom? Yes  Shower chair or bench in shower? No  Elevated toilet seat or a handicapped toilet? No   TIMED UP AND GO:  Was the test performed? No .    Cognitive Function:  Normal cognitive status assessed by direct observation by this Nurse Health Advisor. No abnormalities found.          Immunizations Immunization History  Administered Date(s) Administered   Influenza, High Dose Seasonal PF 08/25/2018   Influenza, Quadrivalent, Recombinant, Inj, Pf 06/01/2019   Influenza-Unspecified 06/02/2013, 05/21/2017, 08/21/2018, 06/01/2019   PFIZER(Purple Top)SARS-COV-2 Vaccination 10/17/2019, 11/12/2019, 05/30/2020   Pneumococcal Conjugate-13 12/13/2014   Pneumococcal Polysaccharide-23 10/29/2012   Zoster Recombinat (Shingrix) 06/01/2019, 06/09/2020    TDAP status: Due, Education has been provided regarding the importance of this vaccine. Advised may receive this vaccine at local pharmacy or Health Dept. Aware to provide a copy of the vaccination record if obtained from local pharmacy or Health Dept. Verbalized acceptance and understanding.  Flu Vaccine status: Due, Education has been provided regarding the importance of this vaccine. Advised may receive this vaccine at local pharmacy or Health Dept. Aware to provide a copy of the vaccination record if obtained from local pharmacy or Health Dept. Verbalized acceptance and understanding.  Pneumococcal vaccine status: Up to date  Covid-19 vaccine status: Information provided on how to obtain vaccines.   Qualifies for Shingles Vaccine? No   Zostavax completed No   Shingrix Completed?: Yes  Screening Tests Health Maintenance  Topic Date Due   Hepatitis C Screening  Never done   TETANUS/TDAP  Never done   COVID-19 Vaccine (4 - Booster for Pfizer series) 09/30/2020   INFLUENZA VACCINE  03/21/2021   COLONOSCOPY (Pts 45-32yrs Insurance coverage will need  to be confirmed)  10/06/2023   DEXA SCAN  Completed   Zoster Vaccines- Shingrix  Completed   HPV VACCINES  Aged  Out   MAMMOGRAM  Discontinued    Health Maintenance  Health Maintenance Due  Topic Date Due   Hepatitis C Screening  Never done   TETANUS/TDAP  Never done   COVID-19 Vaccine (4 - Booster for Pfizer series) 09/30/2020   INFLUENZA VACCINE  03/21/2021    Colorectal cancer screening: Type of screening: Colonoscopy. Completed 2022. Repeat every 3 years  Mammogram status: No longer required due to  .  Bone Density status: Completed 2020. Results reflect: Bone density results: OSTEOPOROSIS. Repeat every 2 years.  Lung Cancer Screening: (Low Dose CT Chest recommended if Age 36-80 years, 30 pack-year currently smoking OR have quit w/in 15years.) does not qualify.   Lung Cancer Screening Referral:   Additional Screening:  Hepatitis C Screening: does qualify  Vision Screening: Recommended annual ophthalmology exams for early detection of glaucoma and other disorders of the eye. Is the patient up to date with their annual eye exam?  Yes  Who is the provider or what is the name of the office in which the patient attends annual eye exams? [facility] If pt is not established with a provider, would they like to be referred to a provider to establish care? No .   Dental Screening: Recommended annual dental exams for proper oral hygiene  Community Resource Referral / Chronic Care Management: CRR required this visit?  No   CCM required this visit?  No      Plan:     I have personally reviewed and noted the following in the patient's chart:   Medical and social history Use of alcohol, tobacco or illicit drugs  Current medications and supplements including opioid prescriptions.  Functional ability and status Nutritional status Physical activity Advanced directives List of other physicians Hospitalizations, surgeries, and ER visits in previous 12  months Vitals Screenings to include cognitive, depression, and falls Referrals and appointments  In addition, I have reviewed and discussed with patient certain preventive protocols, quality metrics, and best practice recommendations. A written personalized care plan for preventive services as well as general preventive health recommendations were provided to patient.     Leroy Kennedy, LPN   05/23/1593   Nurse Notes:

## 2021-05-24 NOTE — Patient Instructions (Signed)
Ms. Shelia Roth , Thank you for taking time to come for your Medicare Wellness Visit. I appreciate your ongoing commitment to your health goals. Please review the following plan we discussed and let me know if I can assist you in the future.   Screening recommendations/referrals: Colonoscopy: up to date Mammogram: not indicated Bone Density: Education provided Recommended yearly ophthalmology/optometry visit for glaucoma screening and checkup Recommended yearly dental visit for hygiene and checkup  Vaccinations: Influenza vaccine: Education provided Pneumococcal vaccine: up to date Tdap vaccine: Education provided Shingles vaccine: up to date    Advanced directives: Education provided  Conditions/risks identified:   Next appointment: 06-10-2021 @ 11:00 Dr. Ethelene Hal   Preventive Care 65 Years and Older, Female Preventive care refers to lifestyle choices and visits with your health care provider that can promote health and wellness. What does preventive care include? A yearly physical exam. This is also called an annual well check. Dental exams once or twice a year. Routine eye exams. Ask your health care provider how often you should have your eyes checked. Personal lifestyle choices, including: Daily care of your teeth and gums. Regular physical activity. Eating a healthy diet. Avoiding tobacco and drug use. Limiting alcohol use. Practicing safe sex. Taking low-dose aspirin every day. Taking vitamin and mineral supplements as recommended by your health care provider. What happens during an annual well check? The services and screenings done by your health care provider during your annual well check will depend on your age, overall health, lifestyle risk factors, and family history of disease. Counseling  Your health care provider may ask you questions about your: Alcohol use. Tobacco use. Drug use. Emotional well-being. Home and relationship well-being. Sexual  activity. Eating habits. History of falls. Memory and ability to understand (cognition). Work and work Statistician. Reproductive health. Screening  You may have the following tests or measurements: Height, weight, and BMI. Blood pressure. Lipid and cholesterol levels. These may be checked every 5 years, or more frequently if you are over 27 years old. Skin check. Lung cancer screening. You may have this screening every year starting at age 25 if you have a 30-pack-year history of smoking and currently smoke or have quit within the past 15 years. Fecal occult blood test (FOBT) of the stool. You may have this test every year starting at age 74. Flexible sigmoidoscopy or colonoscopy. You may have a sigmoidoscopy every 5 years or a colonoscopy every 10 years starting at age 69. Hepatitis C blood test. Hepatitis B blood test. Sexually transmitted disease (STD) testing. Diabetes screening. This is done by checking your blood sugar (glucose) after you have not eaten for a while (fasting). You may have this done every 1-3 years. Bone density scan. This is done to screen for osteoporosis. You may have this done starting at age 56. Mammogram. This may be done every 1-2 years. Talk to your health care provider about how often you should have regular mammograms. Talk with your health care provider about your test results, treatment options, and if necessary, the need for more tests. Vaccines  Your health care provider may recommend certain vaccines, such as: Influenza vaccine. This is recommended every year. Tetanus, diphtheria, and acellular pertussis (Tdap, Td) vaccine. You may need a Td booster every 10 years. Zoster vaccine. You may need this after age 53. Pneumococcal 13-valent conjugate (PCV13) vaccine. One dose is recommended after age 52. Pneumococcal polysaccharide (PPSV23) vaccine. One dose is recommended after age 82. Talk to your health care provider about which  screenings and vaccines  you need and how often you need them. This information is not intended to replace advice given to you by your health care provider. Make sure you discuss any questions you have with your health care provider. Document Released: 09/03/2015 Document Revised: 04/26/2016 Document Reviewed: 06/08/2015 Elsevier Interactive Patient Education  2017 [place] Prevention in the Home Falls can cause injuries. They can happen to people of all ages. There are many things you can do to make your home safe and to help prevent falls. What can I do on the outside of my home? Regularly fix the edges of walkways and driveways and fix any cracks. Remove anything that might make you trip as you walk through a door, such as a raised step or threshold. Trim any bushes or trees on the path to your home. Use bright outdoor lighting. Clear any walking paths of anything that might make someone trip, such as rocks or tools. Regularly check to see if handrails are loose or broken. Make sure that both sides of any steps have handrails. Any raised decks and porches should have guardrails on the edges. Have any leaves, snow, or ice cleared regularly. Use sand or salt on walking paths during winter. Clean up any spills in your garage right away. This includes oil or grease spills. What can I do in the bathroom? Use night lights. Install grab bars by the toilet and in the tub and shower. Do not use towel bars as grab bars. Use non-skid mats or decals in the tub or shower. If you need to sit down in the shower, use a plastic, non-slip stool. Keep the floor dry. Clean up any water that spills on the floor as soon as it happens. Remove soap buildup in the tub or shower regularly. Attach bath mats securely with double-sided non-slip rug tape. Do not have throw rugs and other things on the floor that can make you trip. What can I do in the bedroom? Use night lights. Make sure that you have a light by your bed that  is easy to reach. Do not use any sheets or blankets that are too big for your bed. They should not hang down onto the floor. Have a firm chair that has side arms. You can use this for support while you get dressed. Do not have throw rugs and other things on the floor that can make you trip. What can I do in the kitchen? Clean up any spills right away. Avoid walking on wet floors. Keep items that you use a lot in easy-to-reach places. If you need to reach something above you, use a strong step stool that has a grab bar. Keep electrical cords out of the way. Do not use floor polish or wax that makes floors slippery. If you must use wax, use non-skid floor wax. Do not have throw rugs and other things on the floor that can make you trip. What can I do with my stairs? Do not leave any items on the stairs. Make sure that there are handrails on both sides of the stairs and use them. Fix handrails that are broken or loose. Make sure that handrails are as long as the stairways. Check any carpeting to make sure that it is firmly attached to the stairs. Fix any carpet that is loose or worn. Avoid having throw rugs at the top or bottom of the stairs. If you do have throw rugs, attach them to the floor with carpet  tape. Make sure that you have a light switch at the top of the stairs and the bottom of the stairs. If you do not have them, ask someone to add them for you. What else can I do to help prevent falls? Wear shoes that: Do not have high heels. Have rubber bottoms. Are comfortable and fit you well. Are closed at the toe. Do not wear sandals. If you use a stepladder: Make sure that it is fully opened. Do not climb a closed stepladder. Make sure that both sides of the stepladder are locked into place. Ask someone to hold it for you, if possible. Clearly mark and make sure that you can see: Any grab bars or handrails. First and last steps. Where the edge of each step is. Use tools that help you  move around (mobility aids) if they are needed. These include: Canes. Walkers. Scooters. Crutches. Turn on the lights when you go into a dark area. Replace any light bulbs as soon as they burn out. Set up your furniture so you have a clear path. Avoid moving your furniture around. If any of your floors are uneven, fix them. If there are any pets around you, be aware of where they are. Review your medicines with your doctor. Some medicines can make you feel dizzy. This can increase your chance of falling. Ask your doctor what other things that you can do to help prevent falls. This information is not intended to replace advice given to you by your health care provider. Make sure you discuss any questions you have with your health care provider. Document Released: 06/03/2009 Document Revised: 01/13/2016 Document Reviewed: 09/11/2014 Elsevier Interactive Patient Education  2017 [employer].

## 2021-05-30 DIAGNOSIS — I480 Paroxysmal atrial fibrillation: Secondary | ICD-10-CM | POA: Diagnosis not present

## 2021-05-30 DIAGNOSIS — Z7901 Long term (current) use of anticoagulants: Secondary | ICD-10-CM | POA: Diagnosis not present

## 2021-05-30 DIAGNOSIS — I447 Left bundle-branch block, unspecified: Secondary | ICD-10-CM | POA: Diagnosis not present

## 2021-05-30 DIAGNOSIS — I440 Atrioventricular block, first degree: Secondary | ICD-10-CM | POA: Diagnosis not present

## 2021-05-30 DIAGNOSIS — I119 Hypertensive heart disease without heart failure: Secondary | ICD-10-CM | POA: Diagnosis not present

## 2021-05-31 DIAGNOSIS — I480 Paroxysmal atrial fibrillation: Secondary | ICD-10-CM | POA: Diagnosis not present

## 2021-05-31 DIAGNOSIS — I447 Left bundle-branch block, unspecified: Secondary | ICD-10-CM | POA: Diagnosis not present

## 2021-05-31 DIAGNOSIS — I440 Atrioventricular block, first degree: Secondary | ICD-10-CM | POA: Diagnosis not present

## 2021-06-02 ENCOUNTER — Ambulatory Visit: Payer: Medicare Other | Admitting: Family Medicine

## 2021-06-10 ENCOUNTER — Ambulatory Visit: Payer: Medicare Other | Admitting: Family Medicine

## 2021-06-10 ENCOUNTER — Telehealth: Payer: Self-pay

## 2021-06-10 NOTE — Progress Notes (Signed)
Chronic Care Management Pharmacy Assistant   Name: Shelia Roth  MRN: [id number] DOB: November 03, 1945  Reason for Encounter: Medication Review/Medication Coordination Call   Recent office visits:  05/24/2021 Leroy Kennedy LPN (PCP Office) Medicare Wellness completed, DG Bone Density order placed.  Recent consult visits:  05/30/2021 Talbert Cage NP (Cardiology) No Medication Changes noted  Hospital visits:  None in previous 6 months  Medications: Outpatient Encounter Medications as of 06/10/2021  Medication Sig Note   alendronate (FOSAMAX) 70 MG tablet TAKE ONE TABLET BY MOUTH ONCE WEEKLY BEFORE BREAKFAST ON THURSDAYS    amLODipine (NORVASC) 5 MG tablet TAKE ONE TABLET BY MOUTH EVERY MORNING    atorvastatin (LIPITOR) 10 MG tablet Take 1 tablet (10 mg total) by mouth daily.    BESIVANCE 0.6 % SUSP Place 1 drop into the right eye 3 (three) times daily.    Calcium Carb-Cholecalciferol (CALCIUM + VITAMIN D3 PO) Take 1 tablet by mouth 2 (two) times daily. Calcium 600mg  and Vitamin d3 65mcg    carvedilol (COREG) 12.5 MG tablet TAKE ONE TABLET BY MOUTH AT BREAKFAST AND AT BEDTIME    diclofenac Sodium (VOLTAREN) 1 % GEL Apply 2 g topically 4 (four) times daily as needed.    dorzolamide-timolol (COSOPT) 22.3-6.8 MG/ML ophthalmic solution 1 drop 2 (two) times daily.    ELIQUIS 5 MG TABS tablet TAKE ONE TABLET BY MOUTH EVERY MORNING and TAKE ONE TABLET BY MOUTH EVERYDAY AT BEDTIME    flecainide (TAMBOCOR) 50 MG tablet Take 50 mg by mouth 2 (two) times daily. 08/19/2020: Pres[facility]   losartan (COZAAR) 100 MG tablet TAKE ONE TABLET BY MOUTH EVERY MORNING    mometasone (NASONEX) 50 MCG/ACT nasal spray USE TWO SPRAYS IN EACH NOSTRIL DAILY    Multiple Vitamins-Minerals (EMERGEN-C IMMUNE PLUS) PACK Take 1 Package by mouth daily.    No facility-administered encounter medications on file as of 06/10/2021.    Care Gaps: Hepatitis C Screening Tetanus  Vaccine COVID-19 Vaccine (4- Booster for Pfizer series) Influenza Vaccine Star Rating Drugs: Atorvastatin 10 mg last filled on 05/17/2021 [facility]. Losartan 100 mg last filled on 05/17/2021 [facility] Medication Fill Gaps: None  Reviewed chart for medication changes ahead of medication coordination call.   BP Readings from Last 3 Encounters:  12/08/20 (!) 119/53  12/01/20 110/68  10/05/20 (!) 120/50    No results found for: HGBA1C   Patient obtains medications through Adherence Packaging  30 Days   Last adherence delivery included:  Alendronate (FosaMax) 70 Mg Tablet Weekly on Thursdays - Before Breakfast Amlodipine 5 MG tablet Daily -Breakfast Eliquis 5 MG Tablet Twice daily- Breakfast and bedtime Carvedilol 12.5mg  Tablet Twice daily- Breakfast and bedtime Losartan 100 mg Tablet - Breakfast Flecainide 50 mg tablet twice daily - Breakfast and bedtime Atorvastatin 10 mg one tablet daily- before breakfast  Patient declined medication last month Mometasone (Nasonex) 50 MCG/ACT Nasal spray - 2 sprays daily (Adequate supply) Voltaren 1% Gel PRN- (adequate Supply) Calcium + Vit D (Adequate Supply - OTC) Emergen-C Immune Plus (Adequate Supply - OTC) Ocuvite-Luteine Multivitamin (Adequate Supply - OTC)  Patient is due for next adherence delivery on: 06/22/2021. Called patient and reviewed medications and coordinated delivery.  This delivery to include: Alendronate (FosaMax) 70 Mg Tablet Weekly on Thursdays - Before Breakfast Amlodipine 5 MG tablet Daily -Breakfast Eliquis 5 MG Tablet Twice daily- Breakfast and bedtime Carvedilol 12.5mg  Tablet Twice daily- Breakfast  and bedtime Losartan 100 mg Tablet - Breakfast Flecainide 50 mg tablet twice daily - Breakfast and bedtime Atorvastatin 10 mg one tablet daily- before breakfast  Patient declined the following medications  Mometasone (Nasonex) 50 MCG/ACT Nasal spray - 2  sprays daily (Adequate supply) Voltaren 1% Gel PRN- (adequate Supply) Calcium + Vit D (Adequate Supply - OTC) Emergen-C Immune Plus (Adequate Supply - OTC) Ocuvite-Luteine Multivitamin (Adequate Supply - OTC)  Patient needs refills for None.  Confirmed delivery date of 06/22/2021-2nd route 3 pm - 7 pm, advised patient that pharmacy will contact them the morning of delivery.  Telephone follow up appointment with Care management team member scheduled for : 06/23/2021 at 3:15 pm.  [place] Pharmacist Assistant [phone]

## 2021-06-15 ENCOUNTER — Other Ambulatory Visit: Payer: Self-pay | Admitting: Family Medicine

## 2021-06-21 ENCOUNTER — Telehealth: Payer: Medicare Other

## 2021-06-22 ENCOUNTER — Telehealth: Payer: Self-pay

## 2021-06-22 NOTE — Progress Notes (Signed)
APPOINTMENT REMINDER  Reesa Gotschall was reminded to have all medications, supplements and any blood glucose and blood pressure readings available for review with Junius Argyle, Pharm. D, at her telephone visit on 06/23/2021 at 3:15 pm .  Patient confirm appointment and denies any issue/side effects from her current medications.  Care Gaps: Hepatitis C Screening Tetanus Vaccine COVID-19 Vaccine (4- Booster for Pfizer series) Influenza Vaccine Star Rating Drugs: Atorvastatin 10 mg last filled on 05/17/2021 [facility]. Losartan 100 mg last filled on 05/17/2021 for [facility] day supply [facility] Medication Fill Gaps: None   [place] Pharmacist Assistant [phone]

## 2021-06-23 ENCOUNTER — Ambulatory Visit (INDEPENDENT_AMBULATORY_CARE_PROVIDER_SITE_OTHER): Payer: Medicare Other

## 2021-06-23 DIAGNOSIS — I10 Essential (primary) hypertension: Secondary | ICD-10-CM

## 2021-06-23 DIAGNOSIS — M8000XS Age-related osteoporosis with current pathological fracture, unspecified site, sequela: Secondary | ICD-10-CM

## 2021-06-23 NOTE — Progress Notes (Signed)
Chronic Care Management Pharmacy Note  06/27/2021 Name:  Shelia Roth MRN:  [id number] DOB:  1945-12-03  Summary: Patient presents for CCM follow-up. She continues to utilize Physicist, medical. She is due for repeat DEXA.   Recommendations/Changes made from today's visit: Repeat DEXA Scheduled for 06/30/21  Plan: CPP follow-up 6 months    Subjective: Shelia Roth is an 75 y.o. year old female who is a primary patient of Shelia Hal Mortimer Fries, MD.  The CCM team was consulted for assistance with disease management and care coordination needs.    Engaged with patient by telephone for follow up visit in response to provider referral for pharmacy case management and/or care coordination services.   Consent to Services:  The patient was given information about Chronic Care Management services, agreed to services, and gave verbal consent prior to initiation of services.  Please see initial visit note for detailed documentation.   Patient Care Team: Shelia Maw, MD as PCP - General (Family Medicine) Shelia Roth, [facility] as Pharmacist (Pharmacist)  Recent office visits: 12/01/20: Patient presented to Dr. Ethelene Hal for follow-up.   Recent consult visits: 05/30/21: Patient presented to Talbert Cage (Cardiology)  Hospital visits: None in previous 6 months   Objective:  Lab Results  Component Value Date   CREATININE 0.88 12/01/2020   BUN 26 (H) 12/01/2020   GFR 65.06 12/01/2020   NA 139 12/01/2020   K 4.4 12/01/2020   CALCIUM 9.0 12/01/2020   CO2 28 12/01/2020   GLUCOSE 106 (H) 12/01/2020    Lab Results  Component Value Date/Time   GFR 65.06 12/01/2020 10:27 AM   GFR 62.20 01/13/2020 09:24 AM    Last diabetic Eye exam: No results found for: HMDIABEYEEXA  Last diabetic Foot exam: No results found for: HMDIABFOOTEX   Lab Results  Component Value Date   CHOL 194 12/01/2020   HDL 53.60 12/01/2020   LDLCALC 124 (H) 12/01/2020   TRIG  82.0 12/01/2020   CHOLHDL 4 12/01/2020    Hepatic Function Latest Ref Rng & Units 12/01/2020 01/13/2020 09/03/2018  Total Protein 6.0 - 8.3 g/dL 6.9 6.6 6.7  Albumin 3.5 - 5.2 g/dL 3.8 4.0 3.9  AST 0 - 37 U/L _0 ALT 0 - 35 U/L _1 Alk Phosphatase 39 - 117 U/L 67 62 49  Total Bilirubin 0.2 - 1.2 mg/dL 0.6 0.7 0.6    Lab Results  Component Value Date/Time   TSH 0.48 01/13/2020 09:24 AM    CBC Latest Ref Rng & Units 12/01/2020 04/14/2020 01/13/2020  WBC 4.0 - 10.5 K/uL 4.6 5.4 6.0  Hemoglobin 12.0 - 15.0 g/dL 11.5(L) 11.9(L) 12.0  Hematocrit 36.0 - 46.0 % 34.4(L) 36.5 35.4(L)  Platelets 150.0 - 400.0 K/uL 237.0 285.0 298.0    Lab Results  Component Value Date/Time   VD25OH 59.26 12/01/2020 10:27 AM   VD25OH 59.00 04/14/2020 09:32 AM    Clinical ASCVD: No  The 10-year ASCVD risk score (Arnett DK, et al., 2019) is: 13.4%   Values used to calculate the score:     Age: 25 years     Sex: Female     Is Non-Hispanic African American: No     Diabetic: No     Tobacco smoker: No     Systolic Blood Pressure: 803 mmHg     Is BP treated: Yes     HDL Cholesterol: 53.6 mg/dL     Total Cholesterol: 194 mg/dL    Depression screen PHQ  2/9 05/24/2021 12/01/2020 05/20/2020  Decreased Interest 0 0 0  Down, Depressed, Hopeless 0 0 1  PHQ - 2 Score 0 0 1    -Last DEXA Scan: 06/10/19   T-Score femoral neck: -2.8  T-Score total hip: -2.8  T-Score lumbar spine: -1.2  Social History   Tobacco Use  Smoking Status Never  Smokeless Tobacco Never   BP Readings from Last 3 Encounters:  12/08/20 (!) 119/53  12/01/20 110/68  10/05/20 (!) 120/50   Pulse Readings from Last 3 Encounters:  12/08/20 69  12/01/20 (!) 52  10/05/20 69   Wt Readings from Last 3 Encounters:  12/08/20 170 lb (77.1 kg)  12/01/20 171 lb 9.6 oz (77.8 kg)  10/05/20 170 lb (77.1 kg)   BMI Readings from Last 3 Encounters:  12/08/20 28.29 kg/m  12/01/20 28.56 kg/m  10/05/20 28.29 kg/m     Assessment/Interventions: Review of patient past medical history, allergies, medications, health status, including review of consultants reports, laboratory and other test data, was performed as part of comprehensive evaluation and provision of chronic care management services.   SDOH:  (Social Determinants of Health) assessments and interventions performed: Yes SDOH Interventions    Flowsheet Row Most Recent Value  SDOH Interventions   Financial Strain Interventions Intervention Not Indicated      SDOH Screenings   Alcohol Screen: Low Risk    Last Alcohol Screening Score (AUDIT): 0  Depression (PHQ2-9): Low Risk    PHQ-2 Score: 0  Financial Resource Strain: Low Risk    Difficulty of Paying Living Expenses: Not hard at all  Food Insecurity: No Food Insecurity   Worried About Charity fundraiser in the Last Year: Never true   Ran Out of Food in the Last Year: Never true  Housing: Low Risk    Last Housing Risk Score: 0  Physical Activity: Insufficiently Active   Days of Exercise per Week: 2 days   Minutes of Exercise per Session: 30 min  Social Connections: Moderately Isolated   Frequency of Communication with Friends and Family: More than three times a week   Frequency of Social Gatherings with Friends and Family: More than three times a week   Attends Religious Services: More than 4 times per year   Active Member of [employer] or Organizations: No   Attends Music therapist: Never   Marital Status: Divorced  Stress: No Stress Concern Present   Feeling of Stress : Not at all  Tobacco Use: Low Risk    Smoking Tobacco Use: Never   Smokeless Tobacco Use: Never   Passive Exposure: Not on file  Transportation Needs: No Transportation Needs   Lack of Transportation (Medical): No   Lack of Transportation (Non-Medical): No    CCM Care Plan  Allergies  Allergen Reactions   Codeine Shortness Of Breath    Medications Reviewed Today     Reviewed by Shelia Roth, [facility] (Pharmacist) on 06/13/21 at [place] List Status: <None>   Medication Order Taking? Sig Documenting Provider Last Dose Status Informant  alendronate (FOSAMAX) 70 MG tablet [id number] Yes TAKE ONE TABLET BY MOUTH ONCE WEEKLY BEFORE BREAKFAST ON THURSDAYS Shelia Maw, MD Taking Active   amLODipine [facility]) 5 MG tablet [id number] Yes TAKE ONE TABLET BY MOUTH EVERY MORNING Shelia Maw, MD Taking Active   atorvastatin (LIPITOR) 10 MG tablet [id number] Yes Take 1 tablet (10 mg total) by mouth daily. Shelia Maw, MD Taking Active   BESIVANCE 0.6 %  SUSP [id number]  Place 1 drop into the right eye 3 (three) times daily. [provider]  Active   Calcium Carb-Cholecalciferol (CALCIUM + VITAMIN D3 PO) [id number]  Take 1 tablet by mouth 2 (two) times daily. Calcium 657m and Vitamin d3 215m [provider]  Active   carvedilol (COREG) 12.5 MG tablet [id number] TAKE ONE TABLET BY MOUTH AT BREAKFAST AND AT BEDTIME KrLibby MawMD Taking Active   diclofenac Sodium (VOLTAREN) 1 % GEL [id number] 2 g topically 4 (four) times daily as needed. [provider]  Active   dorzolamide-timolol (COSOPT) 22.3-6.8 MG/ML ophthalmic solution [id number] drop 2 (two) times daily. [provider]  Active   ELIQUIS 5 MG TABS tablet [id number] TAKE ONE TABLET BY MOUTH EVERY MORNING and TAKE ONE TABLET BY MOUTH EVERYDAY AT BEDTIME KrLibby MawMD Taking Active   flecainide (TAMBOCOR) 50 MG tablet [id number] Take 50 mg by mouth 2 (two) times daily. [provider] Taking Active            Med Note (FRudy Jewec 30, 2021 11:08 AM) [facility])  losartan (COZAAR) 100 MG tablet [id number] TAKE ONE TABLET BY MOUTH EVERY MORNING KrLibby MawMD Taking Active   mometasone (NASONEX) 50 MCG/ACT nasal spray [id number] TWO SPRAYS [facility]  DAILY KrLibby MawMD  Active   Multiple Vitamins-Minerals (EDevereux Treatment NetworkMMUNE PLUS) PACK [id number] 1 Package by mouth daily. [provider]  Active             Patient Active Problem List   Diagnosis Date Noted   Iron deficiency 04/14/2020   Elevated LDL cholesterol level 01/13/2020   Anemia 01/13/2020   Post-nasal drip 10/15/2019   Screen for colon cancer 09/03/2018   Age-related osteoporosis with current pathological fracture 10/01/2017   Vitamin D deficiency 10/01/2017   PAF (paroxysmal atrial fibrillation) ([place]/06/2018   Essential hypertension 10/01/2017   LV dysfunction 10/01/2017    Immunization History  Administered Date(s) Administered   Influenza, High Dose Seasonal PF 08/25/2018   Influenza, Quadrivalent, Recombinant, Inj, Pf 06/01/2019   Influenza-Unspecified 06/02/2013, 05/21/2017, 08/21/2018, 06/01/2019   PFIZER(Purple Top)SARS-COV-2 Vaccination 10/17/2019, 11/12/2019, 05/30/2020   Pneumococcal Conjugate-13 12/13/2014   Pneumococcal Polysaccharide-23 10/29/2012   Zoster Recombinat (Shingrix) 06/01/2019, 06/09/2020    Conditions to be addressed/monitored:  Hypertension, Hyperlipidemia, Atrial Fibrillation, and Osteoporosis  Care Plan : General Pharmacy (Adult)  Updates made by FlGermaine PomfretRPH since 06/27/2021 12:00 AM     Problem: Hypertension, Hyperlipidemia, Atrial Fibrillation, and Osteoporosis   Priority: High     Long-Range Goal: Patient-Specific Goal   Start Date: 06/27/2021  Expected End Date: 06/27/2022  This Visit's Progress: On track  Priority: High  Note:   Current Barriers:  No barriers noted   Pharmacist Clinical Goal(s):  Patient will maintain control of blood pressure as evidenced by BP less than 140/90  through collaboration with PharmD and provider.   Interventions: 1:1 collaboration with KrLibby MawMD regarding development and update of comprehensive plan of care as evidenced by  provider attestation and co-signature Inter-disciplinary care team collaboration (see longitudinal plan of care) Comprehensive medication review performed; medication list updated in electronic medical record  Hypertension (BP goal <140/90) -Controlled -Current treatment: Amlodipine 5 mg daily  Carvedilol 12.5 mg twice daily  Losartan 100 mg daily  -Medications previously tried: NA  -Current  home readings:  115/67 113/61 117/61  -Denies hypotensive/hypertensive symptoms -Recommended to continue current medication  Hyperlipidemia: (LDL goal < 100) -Controlled -Current treatment: Atorvastatin 10 mg daily  -Medications previously tried: NA  -Recommended to continue current medication  Atrial Fibrillation (Goal: prevent stroke and major bleeding) -Controlled -CHADSVASC: 3 -Current treatment: Rate control: Flecainide 50 mg twice daily  Anticoagulation: Eliquis 5 mg twice daily  -Medications previously tried: NA -Recommended to continue current medication  Osteoporosis (Goal Prevent fractures) -Controlled -Hip Fracture -Patient is a candidate for pharmacologic treatment due to T-Score < -2.5 in femoral neck and T-Score < -2.5 in total hip  -Current treatment  Alendronate 70 mg weekly (Started Jan 2017) Calcium + D3 600 mg/ 20 mcg twice daily  -Medications previously tried: NA  -Repeat DEXA Ordered, not scheduled. Assisted patient with scheduling DEXA. Given history of hip fracture, recommend bisphosphonate therapy for 10 years, then could consider holiday.  -Recommended to continue current medication  Patient Goals/Self-Care Activities Patient will:  - check blood pressure weekly, document, and provide at future appointments  Follow Up Plan: Telephone follow up appointment with care management team member scheduled for:  12/22/2021 at 3:45 PM      Medication Assistance: None required.  Patient affirms current coverage meets needs.  Compliance/Adherence/Medication fill  history: Care Gaps: Hepatitis C Screening Tetanus Vaccine COVID-19 Vaccine (4- Booster for Pfizer series) Influenza Vaccine  Star-Rating Drugs: Atorvastatin 10 mg last filled on 05/17/2021 [facility]. Losartan 100 mg last filled on 05/17/2021 for [facility] supply [facility]  Patient's preferred pharmacy is:  [facility] - [place], [place] - [address] [place] [address] Phone: [phone] Fax: [phone]  Patient decided to: Util[facility] for medication synchronization, packaging and delivery  Care Plan and Follow Up Patient Decision:  Patient agrees to Care Plan and Follow-up.  Plan: Telephone follow up appointment with care management team member scheduled for:  12/22/2021 at 3:45 PM  Junius Argyle, PharmD, Para March, CPP Clinical Pharmacist [facility] at [facility]  [phone]

## 2021-06-27 NOTE — Patient Instructions (Signed)
Visit Information It was great speaking with you today!  Please let me know if you have any questions about our visit.   Goals Addressed             This Visit's Progress    Track and Manage My Blood Pressure-Hypertension       Timeframe:  Long-Range Goal Priority:  High Start Date:  06/27/2021                           Expected End Date:  06/27/2022                     Follow Up within 30 days   - check blood pressure weekly    Why is this important?   You won't feel high blood pressure, but it can still hurt your blood vessels.  High blood pressure can cause heart or kidney problems. It can also cause a stroke.  Making lifestyle changes like losing a little weight or eating less salt will help.  Checking your blood pressure at home and at different times of the day can help to control blood pressure.  If the doctor prescribes medicine remember to take it the way the doctor ordered.  Call the office if you cannot afford the medicine or if there are questions about it.     Notes:         Patient Care Plan: General Pharmacy (Adult)     Problem Identified: Hypertension, Hyperlipidemia, Atrial Fibrillation, and Osteoporosis   Priority: High     Long-Range Goal: Patient-Specific Goal   Start Date: 06/27/2021  Expected End Date: 06/27/2022  This Visit's Progress: On track  Priority: High  Note:   Current Barriers:  No barriers noted   Pharmacist Clinical Goal(s):  Patient will maintain control of blood pressure as evidenced by BP less than 140/90  through collaboration with PharmD and provider.   Interventions: 1:1 collaboration with Libby Maw, MD regarding development and update of comprehensive plan of care as evidenced by provider attestation and co-signature Inter-disciplinary care team collaboration (see longitudinal plan of care) Comprehensive medication review performed; medication list updated in electronic medical record  Hypertension (BP goal  <140/90) -Controlled -Current treatment: Amlodipine 5 mg daily  Carvedilol 12.5 mg twice daily  Losartan 100 mg daily  -Medications previously tried: NA  -Current home readings:  115/67 113/61 117/61  -Denies hypotensive/hypertensive symptoms -Recommended to continue current medication  Hyperlipidemia: (LDL goal < 100) -Controlled -Current treatment: Atorvastatin 10 mg daily  -Medications previously tried: NA  -Recommended to continue current medication  Atrial Fibrillation (Goal: prevent stroke and major bleeding) -Controlled -CHADSVASC: 3 -Current treatment: Rate control: Flecainide 50 mg twice daily  Anticoagulation: Eliquis 5 mg twice daily  -Medications previously tried: NA -Recommended to continue current medication  Osteoporosis (Goal Prevent fractures) -Controlled -Hip Fracture -Patient is a candidate for pharmacologic treatment due to T-Score < -2.5 in femoral neck and T-Score < -2.5 in total hip  -Current treatment  Alendronate 70 mg weekly (Started Jan 2017) Calcium + D3 600 mg/ 20 mcg twice daily  -Medications previously tried: NA  -Repeat DEXA Ordered, not scheduled. Assisted patient with scheduling DEXA. Given history of hip fracture, recommend bisphosphonate therapy for 10 years, then could consider holiday.  -Recommended to continue current medication  Patient Goals/Self-Care Activities Patient will:  - check blood pressure weekly, document, and provide at future appointments  Follow Up Plan: Telephone follow  up appointment with care management team member scheduled for:  12/22/2021 at 3:45 PM      Patient agreed to services and verbal consent obtained.   The patient verbalized understanding of instructions, educational materials, and care plan provided today and declined offer to receive copy of patient instructions, educational materials, and care plan.   Junius Argyle, PharmD, Para [place], CPP Clinical Pharmacist [facility] at [facility]  [phone]

## 2021-06-30 ENCOUNTER — Ambulatory Visit (HOSPITAL_BASED_OUTPATIENT_CLINIC_OR_DEPARTMENT_OTHER)
Admission: RE | Admit: 2021-06-30 | Discharge: 2021-06-30 | Disposition: A | Discharge status: Home / Self Care | Payer: Medicare Other | Source: Ambulatory Visit | Attending: Family Medicine | Admitting: Family Medicine

## 2021-06-30 ENCOUNTER — Other Ambulatory Visit: Payer: Self-pay

## 2021-06-30 DIAGNOSIS — Z780 Asymptomatic menopausal state: Secondary | ICD-10-CM | POA: Diagnosis not present

## 2021-06-30 DIAGNOSIS — M810 Age-related osteoporosis without current pathological fracture: Secondary | ICD-10-CM | POA: Diagnosis not present

## 2021-06-30 DIAGNOSIS — M8588 Other specified disorders of bone density and structure, other site: Secondary | ICD-10-CM | POA: Diagnosis not present

## 2021-07-12 ENCOUNTER — Telehealth: Payer: Self-pay

## 2021-07-12 NOTE — Progress Notes (Signed)
Chronic Care Management Pharmacy Assistant   Name: Shelia Roth  MRN: [id number] DOB: 10-Mar-1946  Reason for Encounter: Medication Review/Medication Coordination Call.   Recent office visits:  No recent office visit  Recent consult visits:  No recent consult Visit  Hospital visits:  None in previous 6 months  Medications: Outpatient Encounter Medications as of 07/12/2021  Medication Sig Note   alendronate (FOSAMAX) 70 MG tablet TAKE ONE TABLET BY MOUTH ONCE WEEKLY BEFORE BREAKFAST ON THURSDAYS    amLODipine (NORVASC) 5 MG tablet TAKE ONE TABLET BY MOUTH EVERY MORNING    atorvastatin (LIPITOR) 10 MG tablet Take 1 tablet (10 mg total) by mouth daily.    BESIVANCE 0.6 % SUSP Place 1 drop into the right eye 3 (three) times daily.    Calcium Carb-Cholecalciferol (CALCIUM + VITAMIN D3 PO) Take 1 tablet by mouth 2 (two) times daily. Calcium 600mg  and Vitamin d3 39mcg    carvedilol (COREG) 12.5 MG tablet TAKE ONE TABLET BY MOUTH AT BREAKFAST AND AT BEDTIME    diclofenac Sodium (VOLTAREN) 1 % GEL Apply 2 g topically 4 (four) times daily as needed.    dorzolamide-timolol (COSOPT) 22.3-6.8 MG/ML ophthalmic solution 1 drop 2 (two) times daily.    ELIQUIS 5 MG TABS tablet TAKE ONE TABLET BY MOUTH EVERY MORNING and TAKE ONE TABLET BY MOUTH EVERYDAY AT BEDTIME    flecainide (TAMBOCOR) 50 MG tablet Take 50 mg by mouth 2 (two) times daily. 08/19/2020: Pres[facility]   losartan (COZAAR) 100 MG tablet TAKE ONE TABLET BY MOUTH EVERY MORNING    mometasone (NASONEX) 50 MCG/ACT nasal spray USE TWO SPRAYS IN EACH NOSTRIL DAILY    Multiple Vitamins-Minerals (EMERGEN-C IMMUNE PLUS) PACK Take 1 Package by mouth daily.    No facility-administered encounter medications on file as of 07/12/2021.    Care Gaps: Hepatitis C Screening Tetanus Vaccine COVID-19 Vaccine (4- Booster for Pfizer series) Influenza Vaccine Star Rating Drugs: Atorvastatin 10 mg last filled on  06/15/2021 [facility]. Losartan 100 mg last filled on 06/15/2021 for [facility] day supply [facility] Medication Fill Gaps: None  Reviewed chart for medication changes ahead of medication coordination call.  BP Readings from Last 3 Encounters:  12/08/20 (!) 119/53  12/01/20 110/68  10/05/20 (!) 120/50    No results found for: HGBA1C   Patient obtains medications through Adherence Packaging  30 Days   Last adherence delivery included:  Alendronate (FosaMax) 70 Mg Tablet Weekly on Thursdays - Before Breakfast Amlodipine 5 MG tablet Daily -Breakfast Eliquis 5 MG Tablet Twice daily- Breakfast and bedtime Carvedilol 12.5mg  Tablet Twice daily- Breakfast and bedtime Losartan 100 mg Tablet - Breakfast Flecainide 50 mg tablet twice daily - Breakfast and bedtime Atorvastatin 10 mg one tablet daily- before breakfast  Patient declined medications last month  Mometasone (Nasonex) 50 MCG/ACT Nasal spray - 2 sprays daily (Adequate supply) Voltaren 1% Gel PRN- (adequate Supply) Calcium + Vit D (Adequate Supply - OTC) Emergen-C Immune Plus (Adequate Supply - OTC) Ocuvite-Luteine Multivitamin (Adequate Supply - OTC)  Patient is due for next adherence delivery on: 07/22/2021. Called patient and reviewed medications and coordinated delivery.  This delivery to include: Alendronate (FosaMax) 70 Mg Tablet Weekly on Thursdays - Before Breakfast Amlodipine 5 MG tablet Daily -Breakfast Eliquis 5 MG Tablet Twice daily- Breakfast and bedtime Carvedilol 12.5mg  Tablet Twice daily- Breakfast and bedtime Losartan 100 mg Tablet - Breakfast Flecainide 50 mg tablet twice daily -  Breakfast and bedtime Atorvastatin 10 mg one tablet daily- before breakfast  Patient declined the following medications Mometasone (Nasonex) 50 MCG/ACT Nasal spray - 2 sprays daily (Adequate supply) Voltaren 1% Gel PRN- (adequate Supply) Calcium + Vit D (Adequate Supply - OTC) Emergen-C Immune Plus  (Adequate Supply - OTC) Ocuvite-Luteine Multivitamin (Adequate Supply - OTC)  Patient needs refills for Alendronate- (prescribe by PCP).  Confirmed delivery date of 07/22/2021 ([facility]), advised patient that pharmacy will contact them the morning of delivery.  Telephone follow up appointment with care management team member scheduled for:  12/22/2021 at 3:45 PM  [place] Pharmacist Assistant [phone]

## 2021-07-18 ENCOUNTER — Other Ambulatory Visit: Payer: Self-pay | Admitting: Family Medicine

## 2021-07-18 DIAGNOSIS — M8000XS Age-related osteoporosis with current pathological fracture, unspecified site, sequela: Secondary | ICD-10-CM

## 2021-07-19 ENCOUNTER — Encounter: Payer: Self-pay | Admitting: Family Medicine

## 2021-07-19 ENCOUNTER — Other Ambulatory Visit: Payer: Self-pay

## 2021-07-19 ENCOUNTER — Ambulatory Visit (INDEPENDENT_AMBULATORY_CARE_PROVIDER_SITE_OTHER): Payer: Medicare Other | Admitting: Family Medicine

## 2021-07-19 VITALS — BP 108/64 | HR 66 | Temp 97.1°F | Ht 65.0 in | Wt 174.8 lb

## 2021-07-19 DIAGNOSIS — E611 Iron deficiency: Secondary | ICD-10-CM | POA: Diagnosis not present

## 2021-07-19 DIAGNOSIS — E559 Vitamin D deficiency, unspecified: Secondary | ICD-10-CM | POA: Diagnosis not present

## 2021-07-19 DIAGNOSIS — M8000XS Age-related osteoporosis with current pathological fracture, unspecified site, sequela: Secondary | ICD-10-CM

## 2021-07-19 DIAGNOSIS — E7800 Pure hypercholesterolemia, unspecified: Secondary | ICD-10-CM

## 2021-07-19 DIAGNOSIS — I10 Essential (primary) hypertension: Secondary | ICD-10-CM | POA: Diagnosis not present

## 2021-07-19 DIAGNOSIS — D649 Anemia, unspecified: Secondary | ICD-10-CM | POA: Diagnosis not present

## 2021-07-19 MED ORDER — LOSARTAN POTASSIUM 50 MG PO TABS: 50.0000 mg | ORAL_TABLET | Freq: Every day | ORAL | 0 refills | Status: DC

## 2021-07-19 NOTE — Progress Notes (Signed)
Established Patient Office Visit  Subjective:  Patient ID: Shelia Roth, female    DOB: [birth year]  Age: 75 y.o. MRN: [id number]  CC:  Chief Complaint  Patient presents with   Follow-up    6 month follow up, no concerns.     HPI Shelia Roth presents for follow-up of hypertension, elevated LDL with elevated 10-year risk score and initiation of statin therapy, and osteoporosis.  Continues alendronate and calcium with vitamin D supplementation.  Recent bone scan showed a T score of -2.6.  2 years ago it was -2.8.  Patient is having her teeth extracted.  Blood pressure has been low and her last clinic visit in another recent cardiology visit.  She denies lightheadedness or dizziness.  Her high recorded blood pressure at home was 118/66.  Polyps were removed at recent colonoscopy.  EGD at that time was okay.  Advised to follow-up for repeat colonoscopy in 3 years.  Past Medical History:  Diagnosis Date   Asthma    Atrial fibrillation ([place])    Cataract    Chicken pox    Gallstones    Glaucoma    Hyperlipidemia    pt denies, never taken meds   Hypertension    Osteoporosis    Thyroid disease    UTI (urinary tract infection)     Past Surgical History:  Procedure Laterality Date   ABDOMINAL HYSTERECTOMY     [place]   COLONOSCOPY  2011   [facility]   ESOPHAGOGASTRODUODENOSCOPY     right after gallbladder removal    MASTECTOMY Bilateral    for mastitis. Says to do blood pressure on left    PARTIAL HIP ARTHROPLASTY  2017   replacement of head per patient    THYROIDECTOMY  1991    Family History  Problem Relation Age of Onset   COPD Father    Heart disease Father    Hypertension Father    Stomach cancer Father        said that they autospy didn't state but dr thinks he did have this. Died with internal bleeding and cardiac arrest    Diabetes Brother    Hyperlipidemia Brother    Heart disease Daughter    Stroke  Maternal Grandmother    Heart attack Maternal Grandfather    Heart attack Paternal Grandmother    Early death Paternal Grandfather    Colon cancer Neg Hx    Esophageal cancer Neg Hx    Rectal cancer Neg Hx     Social History   Socioeconomic History   Marital status: Divorced    Spouse name: Not on file   Number of children: 1   Years of education: Not on file   Highest education level: Not on file  Occupational History   Occupation: retired  Tobacco Use   Smoking status: Never   Smokeless tobacco: Never  Vaping Use   Vaping Use: Never used  Substance and Sexual Activity   Alcohol use: No   Drug use: Never   Sexual activity: Never  Other Topics Concern   Not on file  Social History Narrative   Not on file   Social Determinants of Health   Financial Resource Strain: Low Risk    Difficulty of Paying Living Expenses: Not hard at all  Food Insecurity: No Food Insecurity   Worried About Estate manager/land agent of Food in the Last Year: Never true   Ran  Out of Food in the Last Year: Never true  Transportation Needs: No Transportation Needs   Lack of Transportation (Medical): No   Lack of Transportation (Non-Medical): No  Physical Activity: Insufficiently Active   Days of Exercise per Week: 2 days   Minutes of Exercise per Session: 30 min  Stress: No Stress Concern Present   Feeling of Stress : Not at all  Social Connections: Moderately Isolated   Frequency of Communication with Friends and Family: More than three times a week   Frequency of Social Gatherings with Friends and Family: More than three times a week   Attends Religious Services: More than 4 times per year   Active Member of [employer] or Organizations: No   Attends Archivist Meetings: Never   Marital Status: Divorced  Human resources officer Violence: Not At Risk   Fear of Current or Ex-Partner: No   Emotionally Abused: No   Physically Abused: No   Sexually Abused: No    Outpatient Medications Prior to Visit   Medication Sig Dispense Refill   alendronate (FOSAMAX) 70 MG tablet TAKE ONE TABLET ONCE WEEKLY BEFORE BREAKFAST ON THURSDAYS 4 tablet 2   amLODipine (NORVASC) 5 MG tablet TAKE ONE TABLET BY MOUTH EVERY MORNING 90 tablet 5   atorvastatin (LIPITOR) 10 MG tablet Take 1 tablet (10 mg total) by mouth daily. 90 tablet 3   Calcium Carb-Cholecalciferol (CALCIUM + VITAMIN D3 PO) Take 1 tablet by mouth 2 (two) times daily. Calcium 600mg  and Vitamin d3 26mcg     carvedilol (COREG) 12.5 MG tablet TAKE ONE TABLET BY MOUTH AT BREAKFAST AND AT BEDTIME 180 tablet 5   diclofenac Sodium (VOLTAREN) 1 % GEL Apply 2 g topically 4 (four) times daily as needed.     ELIQUIS 5 MG TABS tablet TAKE ONE TABLET BY MOUTH EVERY MORNING and TAKE ONE TABLET BY MOUTH EVERYDAY AT BEDTIME 60 tablet 2   flecainide (TAMBOCOR) 50 MG tablet Take 50 mg by mouth 2 (two) times daily.     mometasone (NASONEX) 50 MCG/ACT nasal spray USE TWO SPRAYS IN EACH NOSTRIL DAILY 17 g 12   Multiple Vitamins-Minerals (EMERGEN-C IMMUNE PLUS) PACK Take 1 Package by mouth daily.     BESIVANCE 0.6 % SUSP Place 1 drop into the right eye 3 (three) times daily.     losartan (COZAAR) 100 MG tablet TAKE ONE TABLET BY MOUTH EVERY MORNING 90 tablet 0   dorzolamide-timolol (COSOPT) 22.3-6.8 MG/ML ophthalmic solution 1 drop 2 (two) times daily.     No facility-administered medications prior to visit.    Allergies  Allergen Reactions   Codeine Shortness Of Breath    ROS Review of Systems  Constitutional:  Negative for diaphoresis, fatigue, fever and unexpected weight change.  HENT: Negative.    Respiratory: Negative.    Cardiovascular: Negative.   Gastrointestinal: Negative.   Genitourinary: Negative.   Neurological:  Negative for dizziness, weakness, light-headedness and headaches.  Psychiatric/Behavioral: Negative.       Objective:    Physical Exam Vitals and nursing note reviewed.  Constitutional:      General: She is not in acute  distress.    Appearance: Normal appearance. She is not ill-appearing, toxic-appearing or diaphoretic.  HENT:     Head: Normocephalic and atraumatic.     Right Ear: Tympanic membrane, ear canal and external ear normal.     Left Ear: Tympanic membrane, ear canal and external ear normal.     Mouth/Throat:     Mouth: Mucous  membranes are moist.     Pharynx: Oropharynx is clear. No oropharyngeal exudate or posterior oropharyngeal erythema.  Eyes:     General: No scleral icterus.       Right eye: No discharge.        Left eye: No discharge.     Extraocular Movements: Extraocular movements intact.     Conjunctiva/sclera: Conjunctivae normal.     Pupils: Pupils are equal, round, and reactive to light.  Neck:     Vascular: No carotid bruit.  Cardiovascular:     Rate and Rhythm: Normal rate and regular rhythm.  Pulmonary:     Effort: Pulmonary effort is normal.     Breath sounds: Normal breath sounds.  Abdominal:     General: Bowel sounds are normal.  Musculoskeletal:     Cervical back: No rigidity or tenderness.  Lymphadenopathy:     Cervical: No cervical adenopathy.  Skin:    General: Skin is warm and dry.  Neurological:     Mental Status: She is alert and oriented to person, place, and time.  Psychiatric:        Mood and Affect: Mood normal.        Behavior: Behavior normal.    BP 108/64 (BP Location: Left Arm, Patient Position: Sitting, Cuff Size: Normal)   Pulse 66   Temp (!) 97.1 F (36.2 C) (Temporal)   Ht 5\' 5"  (1.651 m)   Wt 174 lb 12.8 oz (79.3 kg)   LMP  (LMP Unknown)   SpO2 95%   BMI 29.09 kg/m  Wt Readings from Last 3 Encounters:  07/19/21 174 lb 12.8 oz (79.3 kg)  12/08/20 170 lb (77.1 kg)  12/01/20 171 lb 9.6 oz (77.8 kg)     Health Maintenance Due  Topic Date Due   Hepatitis C Screening  Never done   TETANUS/TDAP  Never done   INFLUENZA VACCINE  03/21/2021    There are no preventive care reminders to display for this patient.  Lab Results   Component Value Date   TSH 0.48 01/13/2020   Lab Results  Component Value Date   WBC 4.6 12/01/2020   HGB 11.5 (L) 12/01/2020   HCT 34.4 (L) 12/01/2020   MCV 91.8 12/01/2020   PLT 237.0 12/01/2020   Lab Results  Component Value Date   NA 139 12/01/2020   K 4.4 12/01/2020   CO2 28 12/01/2020   GLUCOSE 106 (H) 12/01/2020   BUN 26 (H) 12/01/2020   CREATININE 0.88 12/01/2020   BILITOT 0.6 12/01/2020   ALKPHOS 67 12/01/2020   AST 12 12/01/2020   ALT 9 12/01/2020   PROT 6.9 12/01/2020   ALBUMIN 3.8 12/01/2020   CALCIUM 9.0 12/01/2020   GFR 65.06 12/01/2020   Lab Results  Component Value Date   CHOL 194 12/01/2020   Lab Results  Component Value Date   HDL 53.60 12/01/2020   Lab Results  Component Value Date   LDLCALC 124 (H) 12/01/2020   Lab Results  Component Value Date   TRIG 82.0 12/01/2020   Lab Results  Component Value Date   CHOLHDL 4 12/01/2020   No results found for: HGBA1C    Assessment & Plan:   Problem List Items Addressed This Visit       Cardiovascular and Mediastinum   Essential hypertension - Primary   Relevant Medications   losartan (COZAAR) 50 MG tablet   Other Relevant Orders   CBC   Comprehensive metabolic panel   Urinalysis, Routine w  reflex microscopic     Musculoskeletal and Integument   Age-related osteoporosis with current pathological fracture     Other   Vitamin D deficiency   Relevant Orders   VITAMIN D 25 Hydroxy (Vit-D Deficiency, Fractures)   Elevated LDL cholesterol level   Relevant Orders   Lipid panel   Anemia   Relevant Orders   CBC   Iron deficiency   Relevant Orders   Iron, TIBC and Ferritin Panel    Meds ordered this encounter  Medications   losartan (COZAAR) 50 MG tablet    Sig: Take 1 tablet (50 mg total) by mouth daily.    Dispense:  90 tablet    Refill:  0     Follow-up: Return in about 2 months (around 09/18/2021), or Return fasting for blood work..   And decreased losartan to 50 mg from  100 mg.  Patient will continue to check and record blood pressures.  Continue all other blood pressure medicines.  Continue atorvastatin.  Continue alendronate and calcium and vitamin D supplementation. Libby Maw, MD

## 2021-07-20 DIAGNOSIS — M8000XS Age-related osteoporosis with current pathological fracture, unspecified site, sequela: Secondary | ICD-10-CM

## 2021-07-20 DIAGNOSIS — I10 Essential (primary) hypertension: Secondary | ICD-10-CM

## 2021-07-21 ENCOUNTER — Other Ambulatory Visit: Payer: Self-pay

## 2021-07-21 ENCOUNTER — Other Ambulatory Visit (INDEPENDENT_AMBULATORY_CARE_PROVIDER_SITE_OTHER): Payer: Medicare Other

## 2021-07-21 DIAGNOSIS — D649 Anemia, unspecified: Secondary | ICD-10-CM

## 2021-07-21 DIAGNOSIS — E7800 Pure hypercholesterolemia, unspecified: Secondary | ICD-10-CM

## 2021-07-21 DIAGNOSIS — E611 Iron deficiency: Secondary | ICD-10-CM | POA: Diagnosis not present

## 2021-07-21 DIAGNOSIS — I10 Essential (primary) hypertension: Secondary | ICD-10-CM | POA: Diagnosis not present

## 2021-07-21 DIAGNOSIS — E559 Vitamin D deficiency, unspecified: Secondary | ICD-10-CM | POA: Diagnosis not present

## 2021-07-21 LAB — URINALYSIS, ROUTINE W REFLEX MICROSCOPIC
Bilirubin Urine: NEGATIVE
Ketones, ur: NEGATIVE
Leukocytes,Ua: NEGATIVE
Nitrite: NEGATIVE
Specific Gravity, Urine: 1.025 (ref 1.000–1.030)
Total Protein, Urine: NEGATIVE
Urine Glucose: NEGATIVE
Urobilinogen, UA: 0.2 (ref 0.0–1.0)
pH: 6 (ref 5.0–8.0)

## 2021-07-21 LAB — CBC
HCT: 36 % (ref 36.0–46.0)
Hemoglobin: 12 g/dL (ref 12.0–15.0)
MCHC: 33.5 g/dL (ref 30.0–36.0)
MCV: 93.9 fl (ref 78.0–100.0)
Platelets: 249 10*3/uL (ref 150.0–400.0)
RBC: 3.83 Mil/uL — ABNORMAL LOW (ref 3.87–5.11)
RDW: 12.6 % (ref 11.5–15.5)
WBC: 3.5 10*3/uL — ABNORMAL LOW (ref 4.0–10.5)

## 2021-07-21 LAB — COMPREHENSIVE METABOLIC PANEL
ALT: 8 U/L (ref 0–35)
AST: 12 U/L (ref 0–37)
Albumin: 3.8 g/dL (ref 3.5–5.2)
Alkaline Phosphatase: 62 U/L (ref 39–117)
BUN: 15 mg/dL (ref 6–23)
CO2: 28 mEq/L (ref 19–32)
Calcium: 8.7 mg/dL (ref 8.4–10.5)
Chloride: 108 mEq/L (ref 96–112)
Creatinine, Ser: 0.86 mg/dL (ref 0.40–1.20)
GFR: 66.58 mL/min (ref >60.00)
Glucose, Bld: 98 mg/dL (ref 70–99)
Potassium: 4 mEq/L (ref 3.5–5.1)
Sodium: 142 mEq/L (ref 135–145)
Total Bilirubin: 0.6 mg/dL (ref 0.2–1.2)
Total Protein: 6.5 g/dL (ref 6.0–8.3)

## 2021-07-21 LAB — LIPID PANEL
Cholesterol: 144 mg/dL (ref 0–200)
HDL: 53.5 mg/dL (ref >39.00)
LDL Cholesterol: 73 mg/dL (ref 0–99)
NonHDL: 90.26
Total CHOL/HDL Ratio: 3
Triglycerides: 88 mg/dL (ref 0.0–149.0)
VLDL: 17.6 mg/dL (ref 0.0–40.0)

## 2021-07-21 LAB — VITAMIN D 25 HYDROXY (VIT D DEFICIENCY, FRACTURES): VITD: 66.5 ng/mL (ref 30.00–100.00)

## 2021-07-22 LAB — IRON,TIBC AND FERRITIN PANEL
%SAT: 33 % (calc) (ref 16–45)
Ferritin: 89 ng/mL (ref 16–288)
Iron: 97 ug/dL (ref 45–160)
TIBC: 298 mcg/dL (calc) (ref 250–450)

## 2021-08-10 ENCOUNTER — Telehealth: Payer: Self-pay | Admitting: Family Medicine

## 2021-08-10 ENCOUNTER — Telehealth: Payer: Self-pay

## 2021-08-10 ENCOUNTER — Other Ambulatory Visit: Payer: Self-pay | Admitting: Family Medicine

## 2021-08-10 DIAGNOSIS — I519 Heart disease, unspecified: Secondary | ICD-10-CM

## 2021-08-10 DIAGNOSIS — I480 Paroxysmal atrial fibrillation: Secondary | ICD-10-CM

## 2021-08-10 DIAGNOSIS — I10 Essential (primary) hypertension: Secondary | ICD-10-CM

## 2021-08-10 NOTE — Progress Notes (Addendum)
Chronic Care Management Pharmacy Assistant   Name: Shelia Roth  MRN: [id number] DOB: [birth year]  Reason for Encounter: Medication Review/Medication Coordination Call.   Recent office visits:  07/19/2021 Dr. Ethelene Hal MD (PCP) Decreased losartan to 50 mg from 100 mg Return in about 2 months  Recent consult visits:  No recent consult visit  Hospital visits:  None in previous 6 months  Medications: Outpatient Encounter Medications as of 08/10/2021  Medication Sig Note   alendronate (FOSAMAX) 70 MG tablet TAKE ONE TABLET ONCE WEEKLY BEFORE BREAKFAST ON THURSDAYS    amLODipine (NORVASC) 5 MG tablet TAKE ONE TABLET BY MOUTH EVERY MORNING    atorvastatin (LIPITOR) 10 MG tablet Take 1 tablet (10 mg total) by mouth daily.    Calcium Carb-Cholecalciferol (CALCIUM + VITAMIN D3 PO) Take 1 tablet by mouth 2 (two) times daily. Calcium 600mg  and Vitamin d3 20mcg    carvedilol (COREG) 12.5 MG tablet TAKE ONE TABLET BY MOUTH AT BREAKFAST AND AT BEDTIME    diclofenac Sodium (VOLTAREN) 1 % GEL Apply 2 g topically 4 (four) times daily as needed.    ELIQUIS 5 MG TABS tablet TAKE ONE TABLET BY MOUTH EVERY MORNING and TAKE ONE TABLET BY MOUTH EVERYDAY AT BEDTIME    flecainide (TAMBOCOR) 50 MG tablet Take 50 mg by mouth 2 (two) times daily. 08/19/2020: [facility]   losartan (COZAAR) 50 MG tablet Take 1 tablet (50 mg total) by mouth daily.    mometasone (NASONEX) 50 MCG/ACT nasal spray USE TWO SPRAYS IN EACH NOSTRIL DAILY    Multiple Vitamins-Minerals (EMERGEN-C IMMUNE PLUS) PACK Take 1 Package by mouth daily.    No facility-administered encounter medications on file as of 08/10/2021.    Care Gaps: Hepatitis C Screening Tetanus Vaccine Influenza Vaccine Star Rating Drugs: Atorvastatin 10 mg last filled on 06/15/2021 [facility]. Losartan (50 mg not on fill report) 100 mg last filled on 06/15/2021 for 30 day supply at [facility] Medication Fill Gaps: None  Reviewed chart for medication changes ahead of medication coordination call.  BP Readings from Last 3 Encounters:  07/19/21 108/64  12/08/20 (!) 119/53  12/01/20 110/68    No results found for: HGBA1C   Patient obtains medications through Adherence Packaging  30 Days   Last adherence delivery included:  Alendronate (FosaMax) 70 Mg Tablet Weekly on Thursdays - Before Breakfast Amlodipine 5 MG tablet Daily -Breakfast Eliquis 5 MG Tablet Twice daily- Breakfast and bedtime Carvedilol 12.5mg  Tablet Twice daily- Breakfast and bedtime Losartan 100 mg Tablet - Breakfast Flecainide 50 mg tablet twice daily - Breakfast and bedtime Atorvastatin 10 mg one tablet daily- before breakfast  Patient declined medication last month Mometasone (Nasonex) 50 MCG/ACT Nasal spray - 2 sprays daily (Adequate supply) Voltaren 1% Gel PRN- (adequate Supply) Calcium + Vit D (Adequate Supply - OTC) Emergen-C Immune Plus (Adequate Supply - OTC) Ocuvite-Luteine Multivitamin (Adequate Supply - OTC)  Patient is due for next adherence delivery on: 08/22/2021. Called patient and reviewed medications and coordinated delivery.  This delivery to include: Alendronate (FosaMax) 70 Mg Tablet Weekly on Thursdays - Before Breakfast Amlodipine 5 MG tablet Daily -Breakfast Eliquis 5 MG Tablet Twice daily- Breakfast and bedtime Carvedilol 12.5mg  Tablet Twice daily- Breakfast and bedtime Losartan 50 mg Tablet - Breakfast Flecainide 50 mg tablet twice daily - Breakfast and bedtime Atorvastatin 10 mg one tablet daily- before breakfast  Patient declined the following medications Mometasone (Nasonex) 50 MCG/ACT Nasal  spray - 2 sprays daily (Adequate supply) Voltaren 1% Gel PRN- (adequate Supply) Calcium + Vit D (Adequate Supply - OTC) Emergen-C Immune Plus (Adequate Supply - OTC) Ocuvite-Luteine Multivitamin (Adequate Supply - OTC)  Patient needs refills for None  Confirmed  delivery date of 08/22/2021, advised patient that pharmacy will contact them the morning of delivery.  Telephone follow up appointment with care management team member scheduled for:  12/22/2021 at 3:45 PM  [place] Pharmacist Assistant [phone]   10 minutes spent in review, coordination, and documentation.  Reviewed by: Beverly Milch, PharmD Clinical Pharmacist [phone]

## 2021-08-10 NOTE — Progress Notes (Signed)
Error

## 2021-09-09 ENCOUNTER — Telehealth: Payer: Self-pay

## 2021-09-09 DIAGNOSIS — I480 Paroxysmal atrial fibrillation: Secondary | ICD-10-CM

## 2021-09-09 NOTE — Progress Notes (Signed)
Chronic Care Management Pharmacy Assistant   Name: Shelia Roth  MRN: [id number] DOB: 1945/12/15  Reason for Encounter: Medication Review/Medication Coordination Call   Recent office visits:  No recent office visit  Recent consult visits:  No recent consult visit  Hospital visits:  None in previous 6 months  Medications: Outpatient Encounter Medications as of 09/09/2021  Medication Sig Note   alendronate (FOSAMAX) 70 MG tablet TAKE ONE TABLET ONCE WEEKLY BEFORE BREAKFAST ON THURSDAYS    amLODipine (NORVASC) 5 MG tablet TAKE ONE TABLET BY MOUTH EVERY MORNING    atorvastatin (LIPITOR) 10 MG tablet Take 1 tablet (10 mg total) by mouth daily.    Calcium Carb-Cholecalciferol (CALCIUM + VITAMIN D3 PO) Take 1 tablet by mouth 2 (two) times daily. Calcium 600mg  and Vitamin d3 47mcg    carvedilol (COREG) 12.5 MG tablet TAKE ONE TABLET BY MOUTH AT BREAKFAST AND AT BEDTIME    diclofenac Sodium (VOLTAREN) 1 % GEL Apply 2 g topically 4 (four) times daily as needed.    ELIQUIS 5 MG TABS tablet TAKE ONE TABLET BY MOUTH EVERY MORNING and TAKE ONE TABLET BY MOUTH EVERYDAY AT BEDTIME    flecainide (TAMBOCOR) 50 MG tablet Take 50 mg by mouth 2 (two) times daily. 08/19/2020: [facility]   losartan (COZAAR) 50 MG tablet Take 1 tablet (50 mg total) by mouth daily.    mometasone (NASONEX) 50 MCG/ACT nasal spray USE TWO SPRAYS IN EACH NOSTRIL DAILY    Multiple Vitamins-Minerals (EMERGEN-C IMMUNE PLUS) PACK Take 1 Package by mouth daily.    No facility-administered encounter medications on file as of 09/09/2021.    Care Gaps: Hepatitis C Screening Tetanus Vaccine Influenza Vaccine COVID-19 Vaccine Star Rating Drugs: Atorvastatin 10 mg last filled on 08/15/2021 for [facility] day supply at [facility]. Losartan 50 mg last filled on 08/15/2021 for [facility] day supply [facility] Medication Fill Gaps: None  Reviewed chart for medication changes ahead of  medication coordination call.  BP Readings from Last 3 Encounters:  07/19/21 108/64  12/08/20 (!) 119/53  12/01/20 110/68    No results found for: HGBA1C   Patient obtains medications through Adherence Packaging  30 Days   Last adherence delivery included:  Alendronate (FosaMax) 70 Mg Tablet Weekly on Thursdays - Before Breakfast Amlodipine 5 MG tablet Daily -Breakfast Eliquis 5 MG Tablet Twice daily- Breakfast and bedtime Carvedilol 12.5mg  Tablet Twice daily- Breakfast and bedtime Losartan 50 mg Tablet - Breakfast Flecainide 50 mg tablet twice daily - Breakfast and bedtime Atorvastatin 10 mg one tablet daily- before breakfast  Patient declined medications last month  Mometasone (Nasonex) 50 MCG/ACT Nasal spray - 2 sprays daily (Adequate supply) Voltaren 1% Gel PRN- (adequate Supply) Calcium + Vit D (Adequate Supply - OTC) Emergen-C Immune Plus (Adequate Supply - OTC) Ocuvite-Luteine Multivitamin (Adequate Supply - OTC)  Patient is due for next adherence delivery on: 09/21/2021. Called patient and reviewed medications and coordinated delivery.  This delivery to include: Alendronate (FosaMax) 70 Mg Tablet Weekly on Thursdays - Before Breakfast Amlodipine 5 MG tablet Daily -Breakfast Eliquis 5 MG Tablet Twice daily- Breakfast and bedtime Carvedilol 12.5mg  Tablet Twice daily- Breakfast and bedtime Losartan 50 mg Tablet - Breakfast Flecainide 50 mg tablet twice daily - Breakfast and bedtime Atorvastatin 10 mg one tablet daily- before breakfast  Patient declined the following medications: Mometasone (Nasonex) 50 MCG/ACT Nasal spray - 2 sprays daily (Adequate supply) Voltaren 1% Gel PRN- (adequate Supply) Calcium + Vit D (  Adequate Supply - OTC) Emergen-C Immune Plus (Adequate Supply - OTC) Ocuvite-Luteine Multivitamin (Adequate Supply - OTC)  Patient needs refills for Eliquis .  Confirmed delivery date of 09/21/2021 (Second route), advised patient that pharmacy will contact  them the morning of delivery.  Telephone follow up appointment with care management team member scheduled for:  12/22/2021 at 3:45 PM  [place] Pharmacist Assistant [phone]

## 2021-09-13 MED ORDER — APIXABAN 5 MG PO TABS: ORAL_TABLET | ORAL | 1 refills | Status: DC

## 2021-09-13 NOTE — Addendum Note (Signed)
Addended by: Daron Offer A on: 09/13/2021 10:15 AM   Modules accepted: Orders

## 2021-09-19 ENCOUNTER — Ambulatory Visit (INDEPENDENT_AMBULATORY_CARE_PROVIDER_SITE_OTHER): Payer: Medicare Other | Admitting: Family Medicine

## 2021-09-19 ENCOUNTER — Other Ambulatory Visit: Payer: Self-pay

## 2021-09-19 ENCOUNTER — Encounter: Payer: Self-pay | Admitting: Family Medicine

## 2021-09-19 VITALS — BP 110/66 | HR 76 | Temp 97.5°F | Ht 65.0 in | Wt 174.6 lb

## 2021-09-19 DIAGNOSIS — E7800 Pure hypercholesterolemia, unspecified: Secondary | ICD-10-CM | POA: Diagnosis not present

## 2021-09-19 DIAGNOSIS — I10 Essential (primary) hypertension: Secondary | ICD-10-CM | POA: Diagnosis not present

## 2021-09-19 NOTE — Progress Notes (Signed)
Established Patient Office Visit  Subjective:  Patient ID: Shelia Roth, female    DOB: 20-Jul-1946  Age: 76 y.o. MRN: [id number]  CC:  Chief Complaint  Patient presents with   Follow-up    2 month follow up on BP medication. No concerns.     HPI Maleeha Roth presents for follow-up of hypertension status post decreasing losartan to 50 mg from 100.  Continues with carvedilol and amlodipine.  Blood pressure has been running in the 120s over 60s before taking her medicine in the morning.  It drops down to the 110 range over 60.  She denies lightheadedness or dizziness.  A. fib is well controlled with flecainide.  Continues with Eliquis.  Some ongoing right hip pain.  Advised to take NSAIDs secondary to gastritis and Eliquis use.  She uses Tylenol as needed for pain.  Continues atorvastatin for cholesterol control.  Past Medical History:  Diagnosis Date   Asthma    Atrial fibrillation ([place])    Cataract    Chicken pox    Gallstones    Glaucoma    Hyperlipidemia    pt denies, never taken meds   Hypertension    Osteoporosis    Thyroid disease    UTI (urinary tract infection)     Past Surgical History:  Procedure Laterality Date   ABDOMINAL HYSTERECTOMY     [place]   COLONOSCOPY  2011   [facility]   ESOPHAGOGASTRODUODENOSCOPY     right after gallbladder removal    MASTECTOMY Bilateral    for mastitis. Says to do blood pressure on left    PARTIAL HIP ARTHROPLASTY  2017   replacement of head per patient    THYROIDECTOMY  1991    Family History  Problem Relation Age of Onset   COPD Father    Heart disease Father    Hypertension Father    Stomach cancer Father        said that they autospy didn't state but dr thinks he did have this. Died with internal bleeding and cardiac arrest    Diabetes Brother    Hyperlipidemia Brother    Heart disease Daughter    Stroke Maternal Grandmother    Heart attack Maternal  Grandfather    Heart attack Paternal Grandmother    Early death Paternal Grandfather    Colon cancer Neg Hx    Esophageal cancer Neg Hx    Rectal cancer Neg Hx     Social History   Socioeconomic History   Marital status: Divorced    Spouse name: Not on file   Number of children: 1   Years of education: Not on file   Highest education level: Not on file  Occupational History   Occupation: retired  Tobacco Use   Smoking status: Never   Smokeless tobacco: Never  Vaping Use   Vaping Use: Never used  Substance and Sexual Activity   Alcohol use: No   Drug use: Never   Sexual activity: Never  Other Topics Concern   Not on file  Social History Narrative   Not on file   Social Determinants of Health   Financial Resource Strain: Low Risk    Difficulty of Paying Living Expenses: Not hard at all  Food Insecurity: No Food Insecurity   Worried About Charity fundraiser in the Last Year: Never true   [place] in the Last Year: Never  true  Transportation Needs: No Transportation Needs   Lack of Transportation (Medical): No   Lack of Transportation (Non-Medical): No  Physical Activity: Insufficiently Active   Days of Exercise per Week: 2 days   Minutes of Exercise per Session: 30 min  Stress: No Stress Concern Present   Feeling of Stress : Not at all  Social Connections: Moderately Isolated   Frequency of Communication with Friends and Family: More than three times a week   Frequency of Social Gatherings with Friends and Family: More than three times a week   Attends Religious Services: More than 4 times per year   Active Member of [employer] or Organizations: No   Attends Archivist Meetings: Never   Marital Status: Divorced  Human resources officer Violence: Not At Risk   Fear of Current or Ex-Partner: No   Emotionally Abused: No   Physically Abused: No   Sexually Abused: No    Outpatient Medications Prior to Visit  Medication Sig Dispense Refill   alendronate  (FOSAMAX) 70 MG tablet TAKE ONE TABLET ONCE WEEKLY BEFORE BREAKFAST ON THURSDAYS 4 tablet 2   apixaban (ELIQUIS) 5 MG TABS tablet TAKE ONE TABLET BY MOUTH EVERY MORNING and TAKE ONE TABLET BY MOUTH EVERYDAY AT BEDTIME 180 tablet 1   atorvastatin (LIPITOR) 10 MG tablet Take 1 tablet (10 mg total) by mouth daily. 90 tablet 3   Calcium Carb-Cholecalciferol (CALCIUM + VITAMIN D3 PO) Take 1 tablet by mouth 2 (two) times daily. Calcium 600mg  and Vitamin d3 30mcg     carvedilol (COREG) 12.5 MG tablet TAKE ONE TABLET BY MOUTH AT BREAKFAST AND AT BEDTIME 180 tablet 0   diclofenac Sodium (VOLTAREN) 1 % GEL Apply 2 g topically 4 (four) times daily as needed.     flecainide (TAMBOCOR) 50 MG tablet Take 50 mg by mouth 2 (two) times daily.     losartan (COZAAR) 50 MG tablet Take 1 tablet (50 mg total) by mouth daily. 90 tablet 0   mometasone (NASONEX) 50 MCG/ACT nasal spray USE TWO SPRAYS IN EACH NOSTRIL DAILY 17 g 12   Multiple Vitamins-Minerals (EMERGEN-C IMMUNE PLUS) PACK Take 1 Package by mouth daily.     amLODipine (NORVASC) 5 MG tablet TAKE ONE TABLET BY MOUTH EVERY MORNING 90 tablet 0   No facility-administered medications prior to visit.    Allergies  Allergen Reactions   Codeine Shortness Of Breath    ROS Review of Systems  Constitutional:  Negative for diaphoresis, fatigue, fever and unexpected weight change.  HENT: Negative.    Eyes:  Negative for photophobia and visual disturbance.  Respiratory: Negative.    Cardiovascular: Negative.   Gastrointestinal: Negative.   Genitourinary: Negative.   Neurological:  Negative for dizziness, weakness, light-headedness and headaches.  Psychiatric/Behavioral: Negative.       Objective:    Physical Exam Vitals and nursing note reviewed.  Constitutional:      General: She is not in acute distress.    Appearance: Normal appearance. She is not ill-appearing, toxic-appearing or diaphoretic.  HENT:     Head: Normocephalic and atraumatic.      Right Ear: External ear normal.     Left Ear: External ear normal.     Mouth/Throat:     Mouth: Mucous membranes are moist.     Pharynx: Oropharynx is clear. No oropharyngeal exudate or posterior oropharyngeal erythema.  Eyes:     General:        Right eye: No discharge.  Left eye: No discharge.     Extraocular Movements: Extraocular movements intact.     Conjunctiva/sclera: Conjunctivae normal.     Pupils: Pupils are equal, round, and reactive to light.  Cardiovascular:     Rate and Rhythm: Normal rate and regular rhythm.  Pulmonary:     Effort: Pulmonary effort is normal.     Breath sounds: Normal breath sounds.  Musculoskeletal:     Cervical back: No rigidity or tenderness.  Lymphadenopathy:     Cervical: No cervical adenopathy.  Skin:    General: Skin is warm and dry.  Neurological:     Mental Status: She is alert and oriented to person, place, and time.  Psychiatric:        Mood and Affect: Mood normal.        Behavior: Behavior normal.    BP 110/66 (BP Location: Left Arm, Patient Position: Sitting, Cuff Size: Normal)    Pulse 76    Temp (!) 97.5 F (36.4 C) (Temporal)    Ht 5\' 5"  (1.651 m)    Wt 174 lb 9.6 oz (79.2 kg)    LMP  (LMP Unknown)    SpO2 96%    BMI 29.05 kg/m  Wt Readings from Last 3 Encounters:  09/19/21 174 lb 9.6 oz (79.2 kg)  07/19/21 174 lb 12.8 oz (79.3 kg)  12/08/20 170 lb (77.1 kg)     Health Maintenance Due  Topic Date Due   Hepatitis C Screening  Never done   TETANUS/TDAP  Never done   INFLUENZA VACCINE  03/21/2021   COVID-19 Vaccine (5 - Booster for Pfizer series) 08/25/2021    There are no preventive care reminders to display for this patient.  Lab Results  Component Value Date   TSH 0.48 01/13/2020   Lab Results  Component Value Date   WBC 3.5 (L) 07/21/2021   HGB 12.0 07/21/2021   HCT 36.0 07/21/2021   MCV 93.9 07/21/2021   PLT 249.0 07/21/2021   Lab Results  Component Value Date   NA 142 07/21/2021   K 4.0  07/21/2021   CO2 28 07/21/2021   GLUCOSE 98 07/21/2021   BUN 15 07/21/2021   CREATININE 0.86 07/21/2021   BILITOT 0.6 07/21/2021   ALKPHOS 62 07/21/2021   AST 12 07/21/2021   ALT 8 07/21/2021   PROT 6.5 07/21/2021   ALBUMIN 3.8 07/21/2021   CALCIUM 8.7 07/21/2021   GFR 66.58 07/21/2021   Lab Results  Component Value Date   CHOL 144 07/21/2021   Lab Results  Component Value Date   HDL 53.50 07/21/2021   Lab Results  Component Value Date   LDLCALC 73 07/21/2021   Lab Results  Component Value Date   TRIG 88.0 07/21/2021   Lab Results  Component Value Date   CHOLHDL 3 07/21/2021   No results found for: HGBA1C    Assessment & Plan:   Problem List Items Addressed This Visit       Cardiovascular and Mediastinum   Essential hypertension - Primary     Other   Elevated LDL cholesterol level    No orders of the defined types were placed in this encounter.   Follow-up: Return in about 6 weeks (around 10/31/2021).  Will discontinue amlodipine.  Continue to check and record blood pressures.  Follow-up in 6 to 8 weeks.  Continue low-dose atorvastatin for cholesterol.  Libby Maw, MD

## 2021-10-10 ENCOUNTER — Telehealth: Payer: Self-pay

## 2021-10-10 DIAGNOSIS — M8000XS Age-related osteoporosis with current pathological fracture, unspecified site, sequela: Secondary | ICD-10-CM

## 2021-10-10 DIAGNOSIS — I10 Essential (primary) hypertension: Secondary | ICD-10-CM

## 2021-10-10 NOTE — Progress Notes (Signed)
Chronic Care Management Pharmacy Assistant   Name: Shelia Roth  MRN: [id number] DOB: [birth year]  Reason for Encounter: Medication Review/Medication Coordination Call.  Recent office visits:  09/19/2021 Dr. Ethelene Hal MD (PCP) Discontinue Amlodipine, Follow-up in 6 to 8 weeks  Recent consult visits:  No recent consult visit  Hospital visits:  None in previous 6 months  Medications: Outpatient Encounter Medications as of 10/10/2021  Medication Sig Note   alendronate (FOSAMAX) 70 MG tablet TAKE ONE TABLET ONCE WEEKLY BEFORE BREAKFAST ON THURSDAYS    apixaban (ELIQUIS) 5 MG TABS tablet TAKE ONE TABLET BY MOUTH EVERY MORNING and TAKE ONE TABLET BY MOUTH EVERYDAY AT BEDTIME    atorvastatin (LIPITOR) 10 MG tablet Take 1 tablet (10 mg total) by mouth daily.    Calcium Carb-Cholecalciferol (CALCIUM + VITAMIN D3 PO) Take 1 tablet by mouth 2 (two) times daily. Calcium 600mg  and Vitamin d3 73mcg    carvedilol (COREG) 12.5 MG tablet TAKE ONE TABLET BY MOUTH AT BREAKFAST AND AT BEDTIME    diclofenac Sodium (VOLTAREN) 1 % GEL Apply 2 g topically 4 (four) times daily as needed.    flecainide (TAMBOCOR) 50 MG tablet Take 50 mg by mouth 2 (two) times daily. 08/19/2020: [facility]   losartan (COZAAR) 50 MG tablet Take 1 tablet (50 mg total) by mouth daily.    mometasone (NASONEX) 50 MCG/ACT nasal spray USE TWO SPRAYS IN EACH NOSTRIL DAILY    Multiple Vitamins-Minerals (EMERGEN-C IMMUNE PLUS) PACK Take 1 Package by mouth daily.    No facility-administered encounter medications on file as of 10/10/2021.    Care Gaps: Hepatitis C Screening Tetanus Vaccine Influenza Vaccine COVID-19 Vaccine [facility] Rating Drugs: Atorvastatin 10 mg last filled on 09/15/2021 [facility]. Losartan 50 mg last filled on 09/15/2021 [facility] Medication Fill Gaps: None  Reviewed chart for medication changes ahead of medication  coordination call.  BP Readings from Last 3 Encounters:  09/19/21 110/66  07/19/21 108/64  12/08/20 (!) 119/53    No results found for: HGBA1C   Patient obtains medications through Adherence Packaging  30 Days   Last adherence delivery included: Alendronate (FosaMax) 70 Mg Tablet Weekly on Thursdays - Before Breakfast Amlodipine 5 MG tablet Daily -Breakfast Eliquis 5 MG Tablet Twice daily- Breakfast and bedtime Carvedilol 12.5mg  Tablet Twice daily- Breakfast and bedtime Losartan 50 mg Tablet - Breakfast Flecainide 50 mg tablet twice daily - Breakfast and bedtime Atorvastatin 10 mg one tablet daily- before breakfast  Patient declined medication last month  Mometasone (Nasonex) 50 MCG/ACT Nasal spray - 2 sprays daily (Adequate supply) Voltaren 1% Gel PRN- (adequate Supply) Calcium + Vit D (Adequate Supply - OTC) Emergen-C Immune Plus (Adequate Supply - OTC) Ocuvite-Luteine Multivitamin (Adequate Supply - OTC)  Patient is due for next adherence delivery on: 10/20/2021. Called patient and reviewed medications and coordinated delivery.  This delivery to include: Alendronate (FosaMax) 70 Mg Tablet Weekly on Thursdays - Before Breakfast Eliquis 5 MG Tablet Twice daily- Breakfast and bedtime Carvedilol 12.5mg  Tablet Twice daily- Breakfast and bedtime Losartan 50 mg Tablet - Breakfast Flecainide 50 mg tablet twice daily - Breakfast and bedtime Atorvastatin 10 mg one tablet daily- before breakfast Mometasone (Nasonex) 50 MCG/ACT Nasal spray - 2 sprays daily   Patient declined the following medications: Amlodipine 5 MG tablet Daily -Breakfast- Provider stop medication on 09/19/2021 Voltaren 1% Gel PRN- (adequate Supply) Calcium + Vit D (Adequate Supply -  OTC) Emergen-C Immune Plus (Adequate Supply - OTC) Ocuvite-Luteine Multivitamin (Adequate Supply - OTC)  Patient needs refills for Losartan, Alendronate - prescribe by PCP.  Confirmed delivery date of 10/20/2021 ([facility]),  advised patient that pharmacy will contact them the morning of delivery.  Telephone follow up appointment with care management team member scheduled for:  12/22/2021 at 3:45 PM  [place] Pharmacist Assistant [phone]

## 2021-10-11 MED ORDER — ALENDRONATE SODIUM 70 MG PO TABS: ORAL_TABLET | ORAL | 1 refills | Status: DC

## 2021-10-11 MED ORDER — LOSARTAN POTASSIUM 50 MG PO TABS: 50.0000 mg | ORAL_TABLET | Freq: Every day | ORAL | 0 refills | Status: DC

## 2021-10-11 NOTE — Addendum Note (Signed)
Addended by: Daron Offer A on: 10/11/2021 11:26 AM   Modules accepted: Orders

## 2021-10-31 ENCOUNTER — Other Ambulatory Visit: Payer: Self-pay

## 2021-11-01 ENCOUNTER — Encounter: Payer: Self-pay | Admitting: Family Medicine

## 2021-11-01 ENCOUNTER — Ambulatory Visit (INDEPENDENT_AMBULATORY_CARE_PROVIDER_SITE_OTHER): Payer: Medicare Other | Admitting: Family Medicine

## 2021-11-01 VITALS — BP 124/68 | HR 66 | Temp 97.4°F | Ht 65.0 in | Wt 175.8 lb

## 2021-11-01 DIAGNOSIS — I10 Essential (primary) hypertension: Secondary | ICD-10-CM | POA: Diagnosis not present

## 2021-11-01 DIAGNOSIS — E7800 Pure hypercholesterolemia, unspecified: Secondary | ICD-10-CM | POA: Diagnosis not present

## 2021-11-01 NOTE — Progress Notes (Signed)
Established Patient Office Visit  Subjective:  Patient ID: Shelia Roth, female    DOB: 10/25/1945  Age: 76 y.o. MRN: [id number]  CC:  Chief Complaint  Patient presents with   Follow-up    6 week follow up on BP. No concerns pt not fasting.     HPI [name] presents for follow-up of hypertension status post DC of amlodipine.  She continues with carvedilol and losartan.  Pressure at home is running in the 120s over 60s consistently.  She has occasional excursions up to 140.  Continues with atorvastatin at low-dose achieving an LDL cholesterol of less than 70.  She is a busy great-grandmother she continues to raise several great-grandchildren.  She is exercising regularly by walking.  Past Medical History:  Diagnosis Date   Asthma    Atrial fibrillation ([facility])    Cataract    Chicken pox    Gallstones    Glaucoma    Hyperlipidemia    pt denies, never taken meds   Hypertension    Osteoporosis    Thyroid disease    UTI (urinary tract infection)     Past Surgical History:  Procedure Laterality Date   ABDOMINAL HYSTERECTOMY     APPENDECTOMY  1984   BREAST SURGERY     CHOLECYSTECTOMY  1980   COLONOSCOPY  2011   [facility]   ESOPHAGOGASTRODUODENOSCOPY     right after gallbladder removal    MASTECTOMY Bilateral    for mastitis. Says to do blood pressure on left    PARTIAL HIP ARTHROPLASTY  2017   replacement of head per patient    THYROIDECTOMY  1991    Family History  Problem Relation Age of Onset   COPD Father    Heart disease Father    Hypertension Father    Stomach cancer Father        said that they autospy didn't state but dr thinks he did have this. Died with internal bleeding and cardiac arrest    Diabetes Brother    Hyperlipidemia Brother    Heart disease Daughter    Stroke Maternal Grandmother    Heart attack Maternal Grandfather    Heart attack Paternal Grandmother    Early death Paternal Grandfather    Colon cancer Neg Hx    Esophageal  cancer Neg Hx    Rectal cancer Neg Hx     Social History   Socioeconomic History   Marital status: Divorced    Spouse name: Not on file   Number of children: 1   Years of education: Not on file   Highest education level: Not on file  Occupational History   Occupation: retired  Tobacco Use   Smoking status: Never   Smokeless tobacco: Never  Vaping Use   Vaping Use: Never used  Substance and Sexual Activity   Alcohol use: No   Drug use: Never   Sexual activity: Never  Other Topics Concern   Not on file  Social History Narrative   Not on file   Social Determinants of Health   Financial Resource Strain: Low Risk    Difficulty of Paying Living Expenses: Not hard at all  Food Insecurity: No Food Insecurity   Worried About Programme researcher, broadcasting/film/video in the Last Year: Never true   Ran Out of Food in the Last Year: Never true  Transportation Needs: No Transportation Needs   Lack of Transportation (Medical): No   Lack of Transportation (Non-Medical): No  Physical Activity: Insufficiently  Active   Days of Exercise per Week: 2 days   Minutes of Exercise per Session: 30 min  Stress: No Stress Concern Present   Feeling of Stress : Not at all  Social Connections: Moderately Isolated   Frequency of Communication with Friends and Family: More than three times a week   Frequency of Social Gatherings with Friends and Family: More than three times a week   Attends Religious Services: More than 4 times per year   Active Member of [employer] or Organizations: No   Attends Banker Meetings: Never   Marital Status: Divorced  Catering manager Violence: Not At Risk   Fear of Current or Ex-Partner: No   Emotionally Abused: No   Physically Abused: No   Sexually Abused: No    Outpatient Medications Prior to Visit  Medication Sig Dispense Refill   alendronate (FOSAMAX) 70 MG tablet TAKE ONE TABLET ONCE WEEKLY BEFORE BREAKFAST ON THURSDAYS 12 tablet 1   apixaban (ELIQUIS) 5 MG TABS  tablet TAKE ONE TABLET BY MOUTH EVERY MORNING and TAKE ONE TABLET BY MOUTH EVERYDAY AT BEDTIME 180 tablet 1   atorvastatin (LIPITOR) 10 MG tablet Take 1 tablet (10 mg total) by mouth daily. 90 tablet 3   Calcium Carb-Cholecalciferol (CALCIUM + VITAMIN D3 PO) Take 1 tablet by mouth 2 (two) times daily. Calcium 600mg  and Vitamin d3     carvedilol (COREG) 12.5 MG tablet TAKE ONE TABLET BY MOUTH AT BREAKFAST AND AT BEDTIME 180 tablet 0   diclofenac Sodium (VOLTAREN) 1 % GEL Apply 2 g topically 4 (four) times daily as needed.     flecainide (TAMBOCOR) 50 MG tablet Take 50 mg by mouth 2 (two) times daily.     losartan (COZAAR) 50 MG tablet Take 1 tablet (50 mg total) by mouth daily. 90 tablet 0   mometasone (NASONEX) 50 MCG/ACT nasal spray USE TWO SPRAYS IN EACH NOSTRIL DAILY 17 g 12   Multiple Vitamins-Minerals (EMERGEN-C IMMUNE PLUS) PACK Take 1 Package by mouth daily.     No facility-administered medications prior to visit.    Allergies  Allergen Reactions   Codeine Shortness Of Breath    ROS Review of Systems  Constitutional:  Negative for chills, diaphoresis, fatigue, fever and unexpected weight change.  HENT: Negative.    Eyes:  Negative for photophobia and visual disturbance.  Respiratory: Negative.    Cardiovascular: Negative.   Gastrointestinal: Negative.   Endocrine: Negative for polyphagia and polyuria.  Genitourinary: Negative.   Musculoskeletal:  Negative for gait problem and joint swelling.  Neurological:  Negative for speech difficulty, weakness, light-headedness and headaches.     Objective:    Physical Exam Vitals and nursing note reviewed.  Constitutional:      General: She is not in acute distress.    Appearance: Normal appearance. She is not ill-appearing, toxic-appearing or diaphoretic.  HENT:     Head: Normocephalic and atraumatic.     Right Ear: Tympanic membrane, ear canal and external ear normal.     Left Ear: Tympanic membrane, ear canal and  external ear normal.     Mouth/Throat:     Mouth: Mucous membranes are moist.     Pharynx: Oropharynx is clear. No oropharyngeal exudate or posterior oropharyngeal erythema.  Eyes:     General: No scleral icterus.       Right eye: No discharge.        Left eye: No discharge.     Extraocular Movements: Extraocular movements intact.  Conjunctiva/sclera: Conjunctivae normal.     Pupils: Pupils are equal, round, and reactive to light.  Neck:     Vascular: No carotid bruit.  Cardiovascular:     Rate and Rhythm: Normal rate and regular rhythm.  Pulmonary:     Effort: Pulmonary effort is normal.     Breath sounds: Normal breath sounds.  Musculoskeletal:     Cervical back: No rigidity or tenderness.  Lymphadenopathy:     Cervical: No cervical adenopathy.  Skin:    General: Skin is warm and dry.  Neurological:     Mental Status: She is alert and oriented to person, place, and time.  Psychiatric:        Mood and Affect: Mood normal.        Behavior: Behavior normal.    BP 124/68 (BP Location: Left Arm, Patient Position: Sitting, Cuff Size: Normal)   Pulse 66   Temp (!) 97.4 F (36.3 C) (Temporal)   Ht 5\' 5"  (1.651 m)   Wt 175 lb 12.8 oz (79.7 kg)   LMP  (LMP Unknown)   SpO2 95%   BMI 29.25 kg/m  Wt Readings from Last 3 Encounters:  11/01/21 175 lb 12.8 oz (79.7 kg)  09/19/21 174 lb 9.6 oz (79.2 kg)  07/19/21 174 lb 12.8 oz (79.3 kg)     Health Maintenance Due  Topic Date Due   Hepatitis C Screening  Never done    There are no preventive care reminders to display for this patient.  Lab Results  Component Value Date   TSH 0.48 01/13/2020   Lab Results  Component Value Date   WBC 3.5 (L) 07/21/2021   HGB 12.0 07/21/2021   HCT 36.0 07/21/2021   MCV 93.9 07/21/2021   PLT 249.0 07/21/2021   Lab Results  Component Value Date   NA 142 07/21/2021   K 4.0 07/21/2021   CO2 28 07/21/2021   GLUCOSE 98 07/21/2021   BUN 15 07/21/2021   CREATININE 0.86 07/21/2021    BILITOT 0.6 07/21/2021   ALKPHOS 62 07/21/2021   AST 12 07/21/2021   ALT 8 07/21/2021   PROT 6.5 07/21/2021   ALBUMIN 3.8 07/21/2021   CALCIUM 8.7 07/21/2021   GFR 66.58 07/21/2021   Lab Results  Component Value Date   CHOL 144 07/21/2021   Lab Results  Component Value Date   HDL 53.50 07/21/2021   Lab Results  Component Value Date   LDLCALC 73 07/21/2021   Lab Results  Component Value Date   TRIG 88.0 07/21/2021   Lab Results  Component Value Date   CHOLHDL 3 07/21/2021   No results found for: HGBA1C    Assessment & Plan:   Problem List Items Addressed This Visit       Cardiovascular and Mediastinum   Essential hypertension - Primary     Other   Elevated LDL cholesterol level    No orders of the defined types were placed in this encounter.  The 10-year ASCVD risk score (Arnett DK, et al., 2019) is: 17.3%   Values used to calculate the score:     Age: 68 years     Sex: Female     Is Non-Hispanic African American: No     Diabetic: No     Tobacco smoker: No     Systolic Blood Pressure: 124 mmHg     Is BP treated: Yes     HDL Cholesterol: 53.5 mg/dL     Total Cholesterol: 144 mg/dL  Follow-up:  Return in about 6 months (around 05/04/2022).   Continue losartan and carvedilol for hypertension and rate control.  New atorvastatin. Mliss Sax, MD

## 2021-11-09 ENCOUNTER — Telehealth: Payer: Self-pay

## 2021-11-09 DIAGNOSIS — I519 Heart disease, unspecified: Secondary | ICD-10-CM

## 2021-11-09 DIAGNOSIS — I10 Essential (primary) hypertension: Secondary | ICD-10-CM

## 2021-11-09 DIAGNOSIS — I480 Paroxysmal atrial fibrillation: Secondary | ICD-10-CM

## 2021-11-09 NOTE — Progress Notes (Signed)
? ? ?Chronic Care Management ?Pharmacy Assistant  ? ?Name: Shelia Roth  MRN: [id number] DOB: 1945/10/23 ? ?Reason for Encounter: Medication Review.Medication Coordination Call. ?  ?Recent office visits:  ?11/01/2021 Dr. Ethelene Hal MD (PCP) No Medication Changes noted ? ?Recent consult visits:  ?No recent consult visit ? ?Hospital visits:  ?None in previous 6 months ? ?Medications: ?Outpatient Encounter Medications as of 11/09/2021  ?Medication Sig Note  ? alendronate (FOSAMAX) 70 MG tablet TAKE ONE TABLET ONCE WEEKLY BEFORE BREAKFAST ON THURSDAYS   ? apixaban (ELIQUIS) 5 MG TABS tablet TAKE ONE TABLET BY MOUTH EVERY MORNING and TAKE ONE TABLET BY MOUTH EVERYDAY AT BEDTIME   ? atorvastatin (LIPITOR) 10 MG tablet Take 1 tablet (10 mg total) by mouth daily.   ? Calcium Carb-Cholecalciferol (CALCIUM + VITAMIN D3 PO) Take 1 tablet by mouth 2 (two) times daily. Calcium '600mg'$  and Vitamin d3 48mg   ? carvedilol (COREG) 12.5 MG tablet TAKE ONE TABLET BY MOUTH AT BREAKFAST AND AT BEDTIME   ? diclofenac Sodium (VOLTAREN) 1 % GEL Apply 2 g topically 4 (four) times daily as needed.   ? flecainide (TAMBOCOR) 50 MG tablet Take 50 mg by mouth 2 (two) times daily. 08/19/2020: [facility]  ? losartan (COZAAR) 50 MG tablet Take 1 tablet (50 mg total) by mouth daily.   ? mometasone (NASONEX) 50 MCG/ACT nasal spray USE TWO SPRAYS IN EACH NOSTRIL DAILY   ? Multiple Vitamins-Minerals (EMERGEN-C IMMUNE PLUS) PACK Take 1 Package by mouth daily.   ? ?No facility-administered encounter medications on file as of 11/09/2021.  ? ? ?Care Gaps: ?Hepatitis C Screening ?AWV- Completed  on 05/24/2021 ? ?Star Rating Drugs: ?Atorvastatin 10 mg last filled on 10/13/2021 [facility] ?Losartan 50 mg last filled on 10/13/2021 [facility]. ? ?Medication Fill Gaps: ?None ID ? ?Reviewed chart for medication changes ahead of medication coordination call. ? ? ?BP Readings  from Last 3 Encounters:  ?11/01/21 124/68  ?09/19/21 110/66  ?07/19/21 108/64  ?  ?No results found for: HGBA1C  ? ?Patient obtains medications through Adherence Packaging  30 Days  ? ?Last adherence delivery included:  ?Alendronate (FosaMax) 70 Mg Tablet Weekly on Thursdays - Before Breakfast ?Eliquis 5 MG Tablet Twice daily- Breakfast and bedtime ?Carvedilol 12.'5mg'$  Tablet Twice daily- Breakfast and bedtime ?Losartan 50 mg Tablet - Breakfast ?Flecainide 50 mg tablet twice daily - Breakfast and bedtime ?Atorvastatin 10 mg one tablet daily- before breakfast ?Mometasone (Nasonex) 50 MCG/ACT Nasal spray - 2 sprays daily  ? ?Patient declined Medications last month: ?Amlodipine 5 MG tablet Daily -Breakfast- Provider stop medication on 09/19/2021 ?Voltaren 1% Gel PRN- (adequate Supply) ?Calcium + Vit D (Adequate Supply - OTC) ?Emergen-C Immune Plus (Adequate Supply - OTC) ?Ocuvite-Luteine Multivitamin (Adequate Supply - OTC) ? ?Patient is due for next adherence delivery on: 11/21/2021. ?Called patient and reviewed medications and coordinated delivery. ? ?This delivery to include: ?Alendronate (FosaMax) 70 Mg Tablet Weekly on Thursdays - Before Breakfast ?Eliquis 5 MG Tablet Twice daily- Breakfast and bedtime ?Carvedilol 12.'5mg'$  Tablet Twice daily- Breakfast and bedtime ?Losartan 50 mg Tablet - Breakfast ?Flecainide 50 mg tablet twice daily - Breakfast and bedtime ?Atorvastatin 10 mg one tablet daily- before breakfast ? ?Patient declined the following medications: ?Mometasone (Nasonex) 50 MCG/ACT Nasal spray - 2 sprays daily (Adequate Supply) ?Voltaren 1% Gel PRN- (adequate Supply) ?Calcium + Vit D (Adequate Supply - OTC) ?Emergen-C Immune Plus (Adequate Supply -  OTC) ?Ocuvite-Luteine Multivitamin (Adequate Supply - OTC) ? ?Patient needs refills for Carvedilol - Prescribe by PCP. ? ?Confirmed delivery date of 11/21/2021 ([facility]), advised patient that pharmacy will contact them the morning of delivery. ? ?Patient is  schedule to follow up with her PCP on 05/05/2022. ? ?Patient denies any questions for the clinical pharmacist. ? ?Anderson Malta ?Clinical Pharmacist Assistant ?[phone]  ? ? ?

## 2021-11-11 MED ORDER — CARVEDILOL 12.5 MG PO TABS: 12.5000 mg | ORAL_TABLET | Freq: Two times a day (BID) | ORAL | 1 refills | Status: DC

## 2021-11-11 NOTE — Addendum Note (Signed)
Addended by: Daron Offer A on: 11/11/2021 02:35 PM ? ? Modules accepted: Orders ? ?

## 2021-12-07 ENCOUNTER — Telehealth: Payer: Self-pay

## 2021-12-07 NOTE — Progress Notes (Signed)
? ? ?Chronic Care Management ?Pharmacy Assistant  ? ?Name: Shelia Roth  MRN: [id number] DOB: 1945-10-13 ? ?Reason for Encounter: Medication Review/Medication Coordination Call. ?  ?Recent office visits:  ?No recent office visit ? ?Recent consult visits:  ?No recent consult visit ? ?Hospital visits:  ?None in previous 6 months ? ?Medications: ?Outpatient Encounter Medications as of 12/07/2021  ?Medication Sig Note  ? alendronate (FOSAMAX) 70 MG tablet TAKE ONE TABLET ONCE WEEKLY BEFORE BREAKFAST ON THURSDAYS   ? apixaban (ELIQUIS) 5 MG TABS tablet TAKE ONE TABLET BY MOUTH EVERY MORNING and TAKE ONE TABLET BY MOUTH EVERYDAY AT BEDTIME   ? atorvastatin (LIPITOR) 10 MG tablet Take 1 tablet (10 mg total) by mouth daily.   ? Calcium Carb-Cholecalciferol (CALCIUM + VITAMIN D3 PO) Take 1 tablet by mouth 2 (two) times daily. Calcium '600mg'$  and Vitamin d3 35mg   ? carvedilol (COREG) 12.5 MG tablet Take 1 tablet (12.5 mg total) by mouth 2 (two) times daily with a meal.   ? diclofenac Sodium (VOLTAREN) 1 % GEL Apply 2 g topically 4 (four) times daily as needed.   ? flecainide (TAMBOCOR) 50 MG tablet Take 50 mg by mouth 2 (two) times daily. 08/19/2020: Prescribed by [facility] KBishop Limbo[facility]  ? losartan (COZAAR) 50 MG tablet Take 1 tablet (50 mg total) by mouth daily.   ? mometasone (NASONEX) 50 MCG/ACT nasal spray USE TWO SPRAYS IN EACH NOSTRIL DAILY   ? Multiple Vitamins-Minerals (EMERGEN-C IMMUNE PLUS) PACK Take 1 Package by mouth daily.   ? ?No facility-administered encounter medications on file as of 12/07/2021.  ? ? ?Care Gaps: ?Hepatitis C Screening ?AWV- Completed  on 05/24/2021 ?  ?Star Rating Drugs: ?Atorvastatin 10 mg last filled on 10/13/2021 [facility] last filled on 11/15/2021 for 30 day supply) ?Losartan 50 mg last filled on 10/13/2021 [facility].([facility] data last filled on 11/15/2021 for 30 day supply) ?   ?Medication Fill Gaps: ?None ID ? ?Reviewed chart for medication changes ahead of medication coordination call. ? ?BP Readings from Last 3 Encounters:  ?11/01/21 124/68  ?09/19/21 110/66  ?07/19/21 108/64  ?  ?No results found for: HGBA1C  ? ?Patient obtains medications through Adherence Packaging  30 Days  ? ?Last adherence delivery included:  ?Alendronate (FosaMax) 70 Mg Tablet Weekly on Thursdays - Before Breakfast ?Eliquis 5 MG Tablet Twice daily- Breakfast and bedtime ?Carvedilol 12.'5mg'$  Tablet Twice daily- Breakfast and bedtime ?Losartan 50 mg Tablet - Breakfast ?Flecainide 50 mg tablet twice daily - Breakfast and bedtime ?Atorvastatin 10 mg one tablet daily- before breakfast ? ?Patient declined medications last month: ?Mometasone (Nasonex) 50 MCG/ACT Nasal spray - 2 sprays daily (Adequate Supply) ?Voltaren 1% Gel PRN- (adequate Supply) ?Calcium + Vit D (Adequate Supply - OTC) ?Emergen-C Immune Plus (Adequate Supply - OTC) ?Ocuvite-Luteine Multivitamin (Adequate Supply - OTC) ? ?Patient is due for next adherence delivery on: 12/20/2021. ?Called patient and reviewed medications and coordinated delivery. ? ?This delivery to include: ?Alendronate (FosaMax) 70 Mg Tablet Weekly on Thursdays - Before Breakfast ?Eliquis 5 MG Tablet Twice daily- Breakfast and bedtime ?Carvedilol 12.'5mg'$  Tablet Twice daily- Breakfast and bedtime ?Losartan 50 mg Tablet - Breakfast ?Flecainide 50 mg tablet twice daily - Breakfast and bedtime ?Atorvastatin 10 mg one tablet daily- before breakfast ? ? ?Patient declined the following medications ?Mometasone (Nasonex) 50 MCG/ACT Nasal spray - 2 sprays daily (Adequate Supply) ?Voltaren 1% Gel PRN- (adequate Supply) ?  Calcium + Vit D (Adequate Supply - OTC) ?Emergen-C Immune Plus (Adequate Supply - OTC) ?Ocuvite-Luteine Multivitamin (Adequate Supply - OTC) ? ?Patient needs refills for Atorvastatin- prescribe by PCP. ? ?Confirmed delivery date of 12/20/2021 ([facility]), advised patient that  pharmacy will contact them the morning of delivery. ? ?Patient is schedule to follow up with her PCP on 05/05/2022. ? ?Bessie Kellihan,CPA ?Clinical Pharmacist Assistant ?[phone]  ? ?

## 2021-12-13 ENCOUNTER — Other Ambulatory Visit: Payer: Self-pay | Admitting: Family Medicine

## 2021-12-13 DIAGNOSIS — E7800 Pure hypercholesterolemia, unspecified: Secondary | ICD-10-CM

## 2021-12-22 ENCOUNTER — Telehealth: Payer: Medicare Other

## 2022-01-09 ENCOUNTER — Telehealth: Payer: Self-pay

## 2022-01-09 DIAGNOSIS — I10 Essential (primary) hypertension: Secondary | ICD-10-CM

## 2022-01-09 MED ORDER — LOSARTAN POTASSIUM 50 MG PO TABS: 50.0000 mg | ORAL_TABLET | Freq: Every day | ORAL | 1 refills | Status: DC

## 2022-01-09 NOTE — Addendum Note (Signed)
Addended by: Daron Offer A on: 01/09/2022 04:07 PM   Modules accepted: Orders

## 2022-01-09 NOTE — Progress Notes (Signed)
Chronic Care Management Pharmacy Assistant   Name: Shelia Roth  MRN: [id number] DOB: May 07, 1946  Reason for Encounter: Medication Review/Medication Coordination Call.  Recent office visits:  No recent office visit  Recent consult visits:  No recent consult visit  Hospital visits:  None in previous 6 months  Medications: Outpatient Encounter Medications as of 01/09/2022  Medication Sig Note   alendronate (FOSAMAX) 70 MG tablet TAKE ONE TABLET ONCE WEEKLY BEFORE BREAKFAST ON THURSDAYS    apixaban (ELIQUIS) 5 MG TABS tablet TAKE ONE TABLET BY MOUTH EVERY MORNING and TAKE ONE TABLET BY MOUTH EVERYDAY AT BEDTIME    atorvastatin (LIPITOR) 10 MG tablet TAKE ONE TABLET BY MOUTH BEFORE BREAKFAST    Calcium Carb-Cholecalciferol (CALCIUM + VITAMIN D3 PO) Take 1 tablet by mouth 2 (two) times daily. Calcium '600mg'$  and Vitamin d3 4mg    carvedilol (COREG) 12.5 MG tablet Take 1 tablet (12.5 mg total) by mouth 2 (two) times daily with a meal.    diclofenac Sodium (VOLTAREN) 1 % GEL Apply 2 g topically 4 (four) times daily as needed.    flecainide (TAMBOCOR) 50 MG tablet Take 50 mg by mouth 2 (two) times daily. 08/19/2020: Prescribed by Dr. KBishop Limbo([facility]   losartan (COZAAR) 50 MG tablet Take 1 tablet (50 mg total) by mouth daily.    mometasone (NASONEX) 50 MCG/ACT nasal spray USE TWO SPRAYS IN EACH NOSTRIL DAILY    Multiple Vitamins-Minerals (EMERGEN-C IMMUNE PLUS) PACK Take 1 Package by mouth daily.    No facility-administered encounter medications on file as of 01/09/2022.    Care Gaps: Hepatitis C Screening AWV- Completed  on 05/24/2021   Star Rating Drugs: Atorvastatin 10 mg last filled on 11/15/2021 [facility].([facility] data last filled on 12/14/2021 for 30 day supply) Losartan 50 mg last filled on 12/13/2021 [facility].   Medication Fill Gaps: None ID  Reviewed chart for medication changes  ahead of medication coordination call.  BP Readings from Last 3 Encounters:  11/01/21 124/68  09/19/21 110/66  07/19/21 108/64    No results found for: HGBA1C   Patient obtains medications through Adherence Packaging  30 Days   Last adherence delivery included:  Alendronate (FosaMax) 70 Mg Tablet Weekly on Thursdays - Before Breakfast Eliquis 5 MG Tablet Twice daily- Breakfast and bedtime Carvedilol 12.'5mg'$  Tablet Twice daily- Breakfast and bedtime Losartan 50 mg Tablet - Breakfast Flecainide 50 mg tablet twice daily - Breakfast and bedtime Atorvastatin 10 mg one tablet daily- before breakfast  Patient declined medications last month  Mometasone (Nasonex) 50 MCG/ACT Nasal spray - 2 sprays daily (Adequate Supply) Voltaren 1% Gel PRN- (adequate Supply) Calcium + Vit D (Adequate Supply - OTC) Emergen-C Immune Plus (Adequate Supply - OTC) Ocuvite-Luteine Multivitamin (Adequate Supply - OTC)  Patient is due for next adherence delivery on: 01/19/2022. Called patient and reviewed medications and coordinated delivery.  This delivery to include: Alendronate (FosaMax) 70 Mg Tablet Weekly on Thursdays - Before Breakfast Eliquis 5 MG Tablet Twice daily- Breakfast and bedtime Carvedilol 12.'5mg'$  Tablet Twice daily- Breakfast and bedtime Losartan 50 mg Tablet - Breakfast Flecainide 50 mg tablet twice daily - Breakfast and bedtime Atorvastatin 10 mg one tablet daily- before breakfast   Patient declined the following medications: Mometasone (Nasonex) 50 MCG/ACT Nasal spray - 2 sprays daily (Adequate Supply) Voltaren 1% Gel PRN- (adequate Supply) Calcium + Vit D (Adequate Supply - OTC) Emergen-C Immune Plus (Adequate Supply -  OTC) Ocuvite-Luteine Multivitamin (Adequate Supply - OTC)  Patient needs refills for Losartan - prescribe by PCP .  Confirmed delivery date of 01/19/2022 ([facility]), advised patient that pharmacy will contact them the morning of delivery.  [place] Pharmacist Assistant [phone]

## 2022-02-16 ENCOUNTER — Telehealth: Payer: Self-pay

## 2022-02-16 NOTE — Progress Notes (Signed)
Chronic Care Management Pharmacy Assistant   Name: Shelia Roth  MRN: [id number] DOB: 03-Sep-1945  Reason for Encounter: Medication Review/Medication Coordination Call.   Recent office visits:  No recent office visit   Recent consult visits:  No recent consult visit   Hospital visits:  None in previous 6 months  Medications: Outpatient Encounter Medications as of 02/16/2022  Medication Sig Note   alendronate (FOSAMAX) 70 MG tablet TAKE ONE TABLET ONCE WEEKLY BEFORE BREAKFAST ON THURSDAYS    apixaban (ELIQUIS) 5 MG TABS tablet TAKE ONE TABLET BY MOUTH EVERY MORNING and TAKE ONE TABLET BY MOUTH EVERYDAY AT BEDTIME    atorvastatin (LIPITOR) 10 MG tablet TAKE ONE TABLET BY MOUTH BEFORE BREAKFAST    Calcium Carb-Cholecalciferol (CALCIUM + VITAMIN D3 PO) Take 1 tablet by mouth 2 (two) times daily. Calcium '600mg'$  and Vitamin d3 61mg    carvedilol (COREG) 12.5 MG tablet Take 1 tablet (12.5 mg total) by mouth 2 (two) times daily with a meal.    diclofenac Sodium (VOLTAREN) 1 % GEL Apply 2 g topically 4 (four) times daily as needed.    flecainide (TAMBOCOR) 50 MG tablet Take 50 mg by mouth 2 (two) times daily. 08/19/2020: [facility]   losartan (COZAAR) 50 MG tablet Take 1 tablet (50 mg total) by mouth daily.    mometasone (NASONEX) 50 MCG/ACT nasal spray USE TWO SPRAYS IN EACH NOSTRIL DAILY    Multiple Vitamins-Minerals (EMERGEN-C IMMUNE PLUS) PACK Take 1 Package by mouth daily.    No facility-administered encounter medications on file as of 02/16/2022.    Care Gaps: Hepatitis C Screening AWV- Completed  on 05/24/2021   Star Rating Drugs: Atorvastatin 10 mg last filled on 11/15/2021 [facility].([facility] last filled on 12/14/2021 for 30 day supply) Losartan 50 mg last filled on 12/13/2021 [facility].   Medication Fill Gaps: None ID  Reviewed chart for medication changes  ahead of medication coordination call.  BP Readings from Last 3 Encounters:  11/01/21 124/68  09/19/21 110/66  07/19/21 108/64    No results found for: "HGBA1C"   Patient obtains medications through Adherence Packaging  30 Days   Last adherence delivery included:  Alendronate (FosaMax) 70 Mg Tablet Weekly on Thursdays - Before Breakfast Eliquis 5 MG Tablet Twice daily- Breakfast and bedtime Carvedilol 12.'5mg'$  Tablet Twice daily- Breakfast and bedtime Losartan 50 mg Tablet - Breakfast Flecainide 50 mg tablet twice daily - Breakfast and bedtime Atorvastatin 10 mg one tablet daily- before breakfast  Patient declined medications last month:  Mometasone (Nasonex) 50 MCG/ACT Nasal spray - 2 sprays daily (Adequate Supply) Voltaren 1% Gel PRN- (adequate Supply) Calcium + Vit D (Adequate Supply - OTC) Emergen-C Immune Plus (Adequate Supply - OTC) Ocuvite-Luteine Multivitamin (Adequate Supply - OTC)  Patient is due for next adherence delivery on: 02/20/2022. Called patient and reviewed medications and coordinated delivery.  This delivery to include: Alendronate (FosaMax) 70 Mg Tablet Weekly on Thursdays - Before Breakfast Eliquis 5 MG Tablet Twice daily- Breakfast and bedtime Carvedilol 12.'5mg'$  Tablet Twice daily- Breakfast and bedtime Losartan 50 mg Tablet - Breakfast Flecainide 50 mg tablet twice daily - Breakfast and bedtime Atorvastatin 10 mg one tablet daily- before breakfast Mometasone (Nasonex) 50 MCG/ACT Nasal spray - 2 sprays daily    Patient declined the following medications: Voltaren 1% Gel PRN- (adequate Supply) Calcium + Vit D (Adequate Supply - OTC) Emergen-C Immune Plus (  Adequate Supply - OTC) Ocuvite-Luteine Multivitamin (Adequate Supply - OTC)  Patient needs refills for None ID.  Confirmed delivery date of 02/20/2022 ([facility]), advised patient that pharmacy will contact them the morning of delivery.  [place] Pharmacist  Assistant [phone]

## 2022-02-20 ENCOUNTER — Other Ambulatory Visit: Payer: Self-pay | Admitting: Family Medicine

## 2022-02-20 DIAGNOSIS — R0982 Postnasal drip: Secondary | ICD-10-CM

## 2022-03-06 ENCOUNTER — Other Ambulatory Visit: Payer: Self-pay | Admitting: Family Medicine

## 2022-03-06 DIAGNOSIS — I480 Paroxysmal atrial fibrillation: Secondary | ICD-10-CM

## 2022-03-09 ENCOUNTER — Telehealth: Payer: Self-pay

## 2022-03-09 NOTE — Progress Notes (Signed)
Chronic Care Management Pharmacy Assistant   Name: Alena Blankenbeckler  MRN: [id number] DOB: 1945/11/03  Reason for Encounter: Medication Review/Medication Coordination Call.   Recent office visits:  No recent office visit   Recent consult visits:  No recent consult visit   Hospital visits:  None in previous 6 months  Medications: Outpatient Encounter Medications as of 03/09/2022  Medication Sig Note   alendronate (FOSAMAX) 70 MG tablet TAKE ONE TABLET ONCE WEEKLY BEFORE BREAKFAST ON THURSDAYS    atorvastatin (LIPITOR) 10 MG tablet TAKE ONE TABLET BY MOUTH BEFORE BREAKFAST    Calcium Carb-Cholecalciferol (CALCIUM + VITAMIN D3 PO) Take 1 tablet by mouth 2 (two) times daily. Calcium '600mg'$  and Vitamin d3 81mg    carvedilol (COREG) 12.5 MG tablet Take 1 tablet (12.5 mg total) by mouth 2 (two) times daily with a meal.    diclofenac Sodium (VOLTAREN) 1 % GEL Apply 2 g topically 4 (four) times daily as needed.    ELIQUIS 5 MG TABS tablet TAKE ONE TABLET BY MOUTH EVERY MORNING and TAKE ONE TABLET BY MOUTH EVERYDAY AT BEDTIME    flecainide (TAMBOCOR) 50 MG tablet Take 50 mg by mouth 2 (two) times daily. 08/19/2020: [facility]   losartan (COZAAR) 50 MG tablet Take 1 tablet (50 mg total) by mouth daily.    mometasone (NASONEX) 50 MCG/ACT nasal spray USE TWO SPRAYS IN EACH NOSTRIL DAILY    Multiple Vitamins-Minerals (EMERGEN-C IMMUNE PLUS) PACK Take 1 Package by mouth daily.    No facility-administered encounter medications on file as of 03/09/2022.    Care Gaps: Hepatitis C Screening AWV- Completed  on 05/24/2021   Star Rating Drugs: Atorvastatin 10 mg last filled on 02/14/2022 [facility]. Losartan 50 mg last filled on 02/14/2022 [facility].   Medication Fill Gaps: None ID  Reviewed chart for medication changes ahead of medication coordination call.  BP Readings from Last 3 Encounters:   11/01/21 124/68  09/19/21 110/66  07/19/21 108/64    No results found for: "HGBA1C"   Patient obtains medications through Adherence Packaging  30 Days   Last adherence delivery included:  Alendronate (FosaMax) 70 Mg Tablet Weekly on Thursdays - Before Breakfast Eliquis 5 MG Tablet Twice daily- Breakfast and bedtime Carvedilol 12.'5mg'$  Tablet Twice daily- Breakfast and bedtime Losartan 50 mg Tablet - Breakfast Flecainide 50 mg tablet twice daily - Breakfast and bedtime Atorvastatin 10 mg one tablet daily- before breakfast Mometasone (Nasonex) 50 MCG/ACT Nasal spray - 2 sprays daily   Patient declined medications last month Voltaren 1% Gel PRN- (adequate Supply) Calcium + Vit D (Adequate Supply - OTC) Emergen-C Immune Plus (Adequate Supply - OTC) Ocuvite-Luteine Multivitamin (Adequate Supply - OTC)  Patient is due for next adherence delivery on: 03/21/2022. Called patient and reviewed medications and coordinated delivery.  This delivery to include: Alendronate (FosaMax) 70 Mg Tablet Weekly on Thursdays - Before Breakfast Eliquis 5 MG Tablet Twice daily- Breakfast and bedtime Carvedilol 12.'5mg'$  Tablet Twice daily- Breakfast and bedtime Losartan 50 mg Tablet - Breakfast Flecainide 50 mg tablet twice daily - Breakfast and bedtime Atorvastatin 10 mg one tablet daily- before breakfast Mometasone (Nasonex) 50 MCG/ACT Nasal spray - 2 sprays daily    Patient declined the following medications: Voltaren 1% Gel PRN- (adequate Supply) Calcium + Vit D (Adequate Supply - OTC) Emergen-C Immune Plus (Adequate Supply - OTC) Ocuvite-Luteine Multivitamin (Adequate Supply - OTC)  Patient needs refills  for None ID.  Confirmed delivery date of 03/21/2022 ([facility]), advised patient that pharmacy will contact them the morning of delivery.  [place] Pharmacist Assistant [phone]

## 2022-03-28 ENCOUNTER — Other Ambulatory Visit: Payer: Self-pay | Admitting: Family Medicine

## 2022-03-28 DIAGNOSIS — M8000XS Age-related osteoporosis with current pathological fracture, unspecified site, sequela: Secondary | ICD-10-CM

## 2022-04-10 ENCOUNTER — Telehealth: Payer: Self-pay

## 2022-04-10 NOTE — Progress Notes (Signed)
Chronic Care Management Pharmacy Assistant   Name: Shelia Roth  MRN: [id number] DOB: 1945/10/11  Reason for Encounter: Medication Review/Medication Coordination Call.   Recent office visits:  No recent office visit   Recent consult visits:  No recent consult visit   Hospital visits:  None in previous 6 months    Medications: Outpatient Encounter Medications as of 04/10/2022  Medication Sig Note   alendronate (FOSAMAX) 70 MG tablet TAKE ONE TABLET BY MOUTH ONCE WEEKLY BEFORE BREAKFAST ON THURSDAYS    atorvastatin (LIPITOR) 10 MG tablet TAKE ONE TABLET BY MOUTH BEFORE BREAKFAST    Calcium Carb-Cholecalciferol (CALCIUM + VITAMIN D3 PO) Take 1 tablet by mouth 2 (two) times daily. Calcium '600mg'$  and Vitamin d3 32mg    carvedilol (COREG) 12.5 MG tablet Take 1 tablet (12.5 mg total) by mouth 2 (two) times daily with a meal.    diclofenac Sodium (VOLTAREN) 1 % GEL Apply 2 g topically 4 (four) times daily as needed.    ELIQUIS 5 MG TABS tablet TAKE ONE TABLET BY MOUTH EVERY MORNING and TAKE ONE TABLET BY MOUTH EVERYDAY AT BEDTIME    flecainide (TAMBOCOR) 50 MG tablet Take 50 mg by mouth 2 (two) times daily. 08/19/2020: Prescribed [facility] Dr[facility] KBishop Limbo[facility]   losartan (COZAAR) 50 MG tablet Take 1 tablet (50 mg total) by mouth daily.    mometasone (NASONEX) 50 MCG/ACT nasal spray USE TWO SPRAYS IN EACH NOSTRIL DAILY    Multiple Vitamins-Minerals (EMERGEN-C IMMUNE PLUS) PACK Take 1 Package by mouth daily.    No facility-administered encounter medications on file as of 04/10/2022.    Care Gaps: Hepatitis C Screening AWV- Completed  on 05/24/2021   Star Rating Drugs: Atorvastatin 10 mg last filled on 03/14/2022 [facility]. Losartan 50 mg last filled on 03/14/2022 [facility].   Medication Fill Gaps: None ID  Reviewed chart for medication changes ahead of medication coordination call.  BP Readings from Last 3  Encounters:  11/01/21 124/68  09/19/21 110/66  07/19/21 108/64    No results found for: "HGBA1C"   Patient obtains medications through Adherence Packaging  30 Days   Last adherence delivery included:  Alendronate (FosaMax) 70 Mg Tablet Weekly on Thursdays - Before Breakfast Eliquis 5 MG Tablet Twice daily- Breakfast and bedtime Carvedilol 12.'5mg'$  Tablet Twice daily- Breakfast and bedtime Losartan 50 mg Tablet - Breakfast Flecainide 50 mg tablet twice daily - Breakfast and bedtime Atorvastatin 10 mg one tablet daily- before breakfast Mometasone (Nasonex) 50 MCG/ACT Nasal spray - 2 sprays daily   Patient declined medications last month: Voltaren 1% Gel PRN- (adequate Supply) Calcium + Vit D (Adequate Supply - OTC) Emergen-C Immune Plus (Adequate Supply - OTC) Ocuvite-Luteine Multivitamin (Adequate Supply - OTC)   Patient is due for next adherence delivery on: 04/19/2022. Called patient and reviewed medications and coordinated delivery.  This delivery to include: Alendronate (FosaMax) 70 Mg Tablet Weekly on Thursdays - Before Breakfast Eliquis 5 MG Tablet Twice daily- Breakfast and bedtime Carvedilol 12.'5mg'$  Tablet Twice daily- Breakfast and bedtime Losartan 50 mg Tablet - Breakfast Flecainide 50 mg tablet twice daily - Breakfast and bedtime Atorvastatin 10 mg one tablet daily- before breakfast   Patient declined the following medications: Mometasone (Nasonex) 50 MCG/ACT Nasal spray - 2 sprays daily (Adequate supply) Voltaren 1% Gel PRN- (adequate Supply) Calcium + Vit D (Adequate Supply - OTC) Emergen-C Immune Plus (Adequate Supply - OTC) Ocuvite-Luteine Multivitamin (Adequate Supply -  OTC)  Patient needs refills for None ID.  Confirmed delivery date of 04/19/2022 (Second route), advised patient that pharmacy will contact them the morning of delivery.  [facility] Pharmacist Assistant [phone]

## 2022-05-05 ENCOUNTER — Ambulatory Visit: Payer: Medicare Other | Admitting: Family Medicine

## 2022-05-07 ENCOUNTER — Other Ambulatory Visit: Payer: Self-pay | Admitting: Family Medicine

## 2022-05-07 DIAGNOSIS — I480 Paroxysmal atrial fibrillation: Secondary | ICD-10-CM

## 2022-05-07 DIAGNOSIS — I519 Heart disease, unspecified: Secondary | ICD-10-CM

## 2022-05-07 DIAGNOSIS — I10 Essential (primary) hypertension: Secondary | ICD-10-CM

## 2022-05-08 ENCOUNTER — Telehealth: Payer: Self-pay

## 2022-05-08 ENCOUNTER — Ambulatory Visit: Payer: Medicare Other | Admitting: Family Medicine

## 2022-05-08 NOTE — Progress Notes (Signed)
Chronic Care Management Pharmacy Assistant   Name: Shelia Roth  MRN: [id number] DOB: 07/13/1946  Reason for Encounter: Medication Review/Medication Coordination Call.   Recent office visits:  No recent office visit   Recent consult visits:  No recent consult visit   Hospital visits:  None in previous 6 months  Medications: Outpatient Encounter Medications as of 05/08/2022  Medication Sig Note   alendronate (FOSAMAX) 70 MG tablet TAKE ONE TABLET BY MOUTH ONCE WEEKLY BEFORE BREAKFAST ON THURSDAYS    atorvastatin (LIPITOR) 10 MG tablet TAKE ONE TABLET BY MOUTH BEFORE BREAKFAST    Calcium Carb-Cholecalciferol (CALCIUM + VITAMIN D3 PO) Take 1 tablet by mouth 2 (two) times daily. Calcium '600mg'$  and Vitamin d3 62mg    carvedilol (COREG) 12.5 MG tablet TAKE ONE TABLET BY MOUTH AT BREAKFAST AND AT BEDTIME    diclofenac Sodium (VOLTAREN) 1 % GEL Apply 2 g topically 4 (four) times daily as needed.    ELIQUIS 5 MG TABS tablet TAKE ONE TABLET BY MOUTH EVERY MORNING and TAKE ONE TABLET BY MOUTH EVERYDAY AT BEDTIME    flecainide (TAMBOCOR) 50 MG tablet Take 50 mg by mouth 2 (two) times daily. 08/19/2020: [facility] KBishop Limbo[facility]   losartan (COZAAR) 50 MG tablet Take 1 tablet (50 mg total) by mouth daily.    mometasone (NASONEX) 50 MCG/ACT nasal spray USE TWO SPRAYS IN EACH NOSTRIL DAILY    Multiple Vitamins-Minerals (EMERGEN-C IMMUNE PLUS) PACK Take 1 Package by mouth daily.    No facility-administered encounter medications on file as of 05/08/2022.   Care Gaps: Hepatitis C Screening AWV- Completed  on 05/24/2021   Star Rating Drugs: Atorvastatin 10 mg last filled on 04/17/2022 [facility]. Losartan 50 mg last filled on 04/17/2022 for [facility].   Medication Fill Gaps: None ID   Reviewed chart for medication changes ahead of medication coordination call.  BP Readings from Last 3 Encounters:  11/01/21 124/68   09/19/21 110/66  07/19/21 108/64    No results found for: "HGBA1C"   Patient obtains medications through Adherence Packaging  30 Days   Last adherence delivery included:  Alendronate (FosaMax) 70 Mg Tablet Weekly on Thursdays - Before Breakfast Eliquis 5 MG Tablet Twice daily- Breakfast and bedtime Carvedilol 12.'5mg'$  Tablet Twice daily- Breakfast and bedtime Losartan 50 mg Tablet - Breakfast Flecainide 50 mg tablet twice daily - Breakfast and bedtime Atorvastatin 10 mg one tablet daily- before breakfast  Patient declined medications last month : Mometasone (Nasonex) 50 MCG/ACT Nasal spray - 2 sprays daily (Adequate supply) Voltaren 1% Gel PRN- (adequate Supply) Calcium + Vit D (Adequate Supply - OTC) Emergen-C Immune Plus (Adequate Supply - OTC) Ocuvite-Luteine Multivitamin (Adequate Supply - OTC)  Patient is due for next adherence delivery on: 05/18/2022. Called patient and reviewed medications and coordinated delivery.  This delivery to include: Alendronate (FosaMax) 70 Mg Tablet Weekly on Thursdays - Before Breakfast Eliquis 5 MG Tablet Twice daily- Breakfast and bedtime Carvedilol 12.'5mg'$  Tablet Twice daily- Breakfast and bedtime Losartan 50 mg Tablet - Breakfast Flecainide 50 mg tablet twice daily - Breakfast and bedtime Atorvastatin 10 mg one tablet daily- before breakfast   Patient declined the following medications Mometasone (Nasonex) 50 MCG/ACT Nasal spray - 2 sprays daily (Adequate supply) Voltaren 1% Gel PRN- (adequate Supply) Calcium + Vit D (Adequate Supply - OTC) Emergen-C Immune Plus (Adequate Supply - OTC) Ocuvite-Luteine Multivitamin (Adequate Supply - OTC)  Patient needs refills  for Flecainide .  Confirmed delivery date of 05/17/2022 ([facility]), advised patient that pharmacy will contact them the morning of delivery.  [place] Pharmacist Assistant [phone]

## 2022-05-18 ENCOUNTER — Ambulatory Visit (INDEPENDENT_AMBULATORY_CARE_PROVIDER_SITE_OTHER): Payer: Medicare Other | Admitting: Family Medicine

## 2022-05-18 ENCOUNTER — Encounter: Payer: Self-pay | Admitting: Family Medicine

## 2022-05-18 VITALS — BP 122/70 | HR 60 | Temp 97.8°F | Ht 65.0 in | Wt 172.4 lb

## 2022-05-18 DIAGNOSIS — D649 Anemia, unspecified: Secondary | ICD-10-CM

## 2022-05-18 DIAGNOSIS — I10 Essential (primary) hypertension: Secondary | ICD-10-CM

## 2022-05-18 DIAGNOSIS — E7800 Pure hypercholesterolemia, unspecified: Secondary | ICD-10-CM

## 2022-05-18 LAB — CBC
HCT: 36.3 % (ref 36.0–46.0)
Hemoglobin: 12 g/dL (ref 12.0–15.0)
MCHC: 33.1 g/dL (ref 30.0–36.0)
MCV: 94.3 fl (ref 78.0–100.0)
Platelets: 226 10*3/uL (ref 150.0–400.0)
RBC: 3.85 Mil/uL — ABNORMAL LOW (ref 3.87–5.11)
RDW: 13.6 % (ref 11.5–15.5)
WBC: 4.1 10*3/uL (ref 4.0–10.5)

## 2022-05-18 LAB — BASIC METABOLIC PANEL
BUN: 24 mg/dL — ABNORMAL HIGH (ref 6–23)
CO2: 28 mEq/L (ref 19–32)
Calcium: 9.4 mg/dL (ref 8.4–10.5)
Chloride: 107 mEq/L (ref 96–112)
Creatinine, Ser: 0.85 mg/dL (ref 0.40–1.20)
GFR: 67.13 mL/min (ref >60.00)
Glucose, Bld: 83 mg/dL (ref 70–99)
Potassium: 3.7 mEq/L (ref 3.5–5.1)
Sodium: 141 mEq/L (ref 135–145)

## 2022-05-18 LAB — LDL CHOLESTEROL, DIRECT: Direct LDL: 63 mg/dL

## 2022-05-18 NOTE — Progress Notes (Signed)
Established Patient Office Visit  Subjective   Patient ID: Shelia Roth, female    DOB: Aug 30, 1945  Age: 76 y.o. MRN: [id number]  Chief Complaint  Patient presents with   Follow-up    6 month follow up, no concerns. Patient not fasting.    HPI follow-up of hypertension and elevated ldl cholesterol. Blood pressure well controlled on current regimen without amlodipine. Continues with atorvastatin without issue. Exercsing by walking. Ongoing dental care.     Review of Systems  Constitutional: Negative.   HENT: Negative.    Eyes:  Negative for blurred vision, discharge and redness.  Respiratory: Negative.    Cardiovascular: Negative.   Gastrointestinal:  Negative for abdominal pain.  Genitourinary: Negative.   Musculoskeletal: Negative.  Negative for myalgias.  Skin:  Negative for rash.  Neurological:  Negative for tingling, loss of consciousness and weakness.  Endo/Heme/Allergies:  Negative for polydipsia.      Objective:     BP 122/70 (BP Location: Left Arm, Patient Position: Sitting, Cuff Size: Normal)   Pulse 60   Temp 97.8 F (36.6 C) (Temporal)   Ht '5\' 5"'$  (1.651 m)   Wt 172 lb 6.4 oz (78.2 kg)   LMP  (LMP Unknown)   SpO2 98%   BMI 28.69 kg/m    Physical Exam Constitutional:      General: She is not in acute distress.    Appearance: Normal appearance. She is not ill-appearing, toxic-appearing or diaphoretic.  HENT:     Head: Normocephalic and atraumatic.     Right Ear: External ear normal.     Left Ear: External ear normal.     Mouth/Throat:     Mouth: Mucous membranes are moist.     Pharynx: Oropharynx is clear. No oropharyngeal exudate or posterior oropharyngeal erythema.  Eyes:     General: No scleral icterus.       Right eye: No discharge.        Left eye: No discharge.     Extraocular Movements: Extraocular movements intact.     Conjunctiva/sclera: Conjunctivae normal.     Pupils: Pupils are equal, round, and reactive to light.  Cardiovascular:      Rate and Rhythm: Normal rate and regular rhythm.  Pulmonary:     Effort: Pulmonary effort is normal. No respiratory distress.     Breath sounds: Normal breath sounds.  Abdominal:     General: Bowel sounds are normal.  Musculoskeletal:     Cervical back: No rigidity or tenderness.  Skin:    General: Skin is warm and dry.  Neurological:     Mental Status: She is alert and oriented to person, place, and time.  Psychiatric:        Mood and Affect: Mood normal.        Behavior: Behavior normal.      No results found for any visits on 05/18/22.    The 10-year ASCVD risk score (Arnett DK, et al., 2019) is: 18.8%    Assessment & Plan:   Problem List Items Addressed This Visit       Cardiovascular and Mediastinum   Essential hypertension - Primary   Relevant Orders   CBC   Basic metabolic panel     Other   Elevated LDL cholesterol level   Relevant Orders   LDL cholesterol, direct   Anemia   Relevant Medications   ferrous sulfate 324 MG TBEC   Other Relevant Orders   CBC   Iron, TIBC and Ferritin Panel  Return in about 6 months (around 11/16/2022).  Continue current medications and follow-up with cardiology.  Flu and COVID shot scheduled for next week.  Recommended RSV vaccine.  Libby Maw, MD

## 2022-05-19 LAB — IRON,TIBC AND FERRITIN PANEL
%SAT: 35 % (calc) (ref 16–45)
Ferritin: 74 ng/mL (ref 16–288)
Iron: 103 ug/dL (ref 45–160)
TIBC: 297 mcg/dL (calc) (ref 250–450)

## 2022-05-30 ENCOUNTER — Ambulatory Visit (INDEPENDENT_AMBULATORY_CARE_PROVIDER_SITE_OTHER): Payer: Medicare Other

## 2022-05-30 VITALS — Ht 64.0 in | Wt 172.0 lb

## 2022-05-30 DIAGNOSIS — Z0000 Encounter for general adult medical examination without abnormal findings: Secondary | ICD-10-CM

## 2022-05-30 NOTE — Progress Notes (Signed)
Subjective:   Shelia Roth is a 76 y.o. female who presents for Medicare Annual (Subsequent) preventive examination.   Virtual Visit via Telephone Note  I connected with  Shelia Roth on 05/30/22 at  8:45 AM EDT by telephone and verified that I am speaking with the correct person using two identifiers.  Location: Patient: home  Provider: [facility]  Persons participating in the virtual visit: patient/Nurse Health Advisor   I discussed the limitations, risks, security and privacy concerns of performing an evaluation and management service by telephone and the availability of in person appointments. The patient expressed understanding and agreed to proceed.  Interactive audio and video telecommunications were attempted between this nurse and patient, however failed, due to patient having technical difficulties OR patient did not have access to video capability.  We continued and completed visit with audio only.  Some vital signs may be absent or patient reported.   Daphane Shepherd, LPN  Review of Systems     Cardiac Risk Factors include: advanced age (>29mn, >>65women);hypertension     Objective:    Today's Vitals   05/30/22 0853  Weight: 172 lb (78 kg)  Height: '5\' 4"'$  (1.626 m)   Body mass index is 29.52 kg/m.     05/30/2022    8:57 AM 05/24/2021    9:23 AM 05/20/2020    9:01 AM 05/14/2019   11:01 AM  Advanced Directives  Does Patient Have a Medical Advance Directive? Yes No No No  Type of AP[place]amed[place]of [place] will     Copy of [place] Chart? No - copy requested     Would patient like information on creating a medical advance directive?  No - Patient declined Yes (MAU/Ambulatory/Procedural Areas - Information given) No - Patient declined    Current Medications (verified) Outpatient Encounter Medications as of 05/30/2022  Medication Sig   alendronate (FOSAMAX) 70 MG tablet TAKE ONE TABLET BY MOUTH ONCE WEEKLY  BEFORE BREAKFAST ON THURSDAYS   atorvastatin (LIPITOR) 10 MG tablet TAKE ONE TABLET BY MOUTH BEFORE BREAKFAST   Calcium Carb-Cholecalciferol (CALCIUM + VITAMIN D3 PO) Take 1 tablet by mouth 2 (two) times daily. Calcium '600mg'$  and Vitamin d3 23m   carvedilol (COREG) 12.5 MG tablet TAKE ONE TABLET BY MOUTH AT BREAKFAST AND AT BEDTIME   diclofenac Sodium (VOLTAREN) 1 % GEL Apply 2 g topically 4 (four) times daily as needed.   ELIQUIS 5 MG TABS tablet TAKE ONE TABLET BY MOUTH EVERY MORNING and TAKE ONE TABLET BY MOUTH EVERYDAY AT BEDTIME   ferrous sulfate 324 MG TBEC Take 324 mg by mouth.   losartan (COZAAR) 50 MG tablet Take 1 tablet (50 mg total) by mouth daily.   mometasone (NASONEX) 50 MCG/ACT nasal spray USE TWO SPRAYS IN EACH NOSTRIL DAILY   Multiple Vitamins-Minerals (EMERGEN-C IMMUNE PLUS) PACK Take 1 Package by mouth daily.   flecainide (TAMBOCOR) 50 MG tablet Take 50 mg by mouth 2 (two) times daily.   No facility-administered encounter medications on file as of 05/30/2022.    Allergies (verified) Codeine   History: Past Medical History:  Diagnosis Date   Asthma    Atrial fibrillation ([place]   Cataract    Chicken pox    Gallstones    Glaucoma    Hyperlipidemia    pt denies, never taken meds   Hypertension    Osteoporosis    Thyroid disease    UTI (urinary tract infection)    Past  Surgical History:  Procedure Laterality Date   ABDOMINAL HYSTERECTOMY     APPENDECTOMY  1984   BREAST SURGERY     CHOLECYSTECTOMY  1980   COLONOSCOPY  2011   [facility]   ESOPHAGOGASTRODUODENOSCOPY     right after gallbladder removal    MASTECTOMY Bilateral    for mastitis. Says to do blood pressure on left    PARTIAL HIP ARTHROPLASTY  2017   replacement of head per patient    THYROIDECTOMY  1991   Family History  Problem Relation Age of Onset   COPD Father    Heart disease Father    Hypertension Father    Stomach cancer Father        said that they autospy didn't state but dr  thinks he did have this. Died with internal bleeding and cardiac arrest    Diabetes Brother    Hyperlipidemia Brother    Heart disease Daughter    Stroke Maternal Grandmother    Heart attack Maternal Grandfather    Heart attack Paternal Grandmother    Early death Paternal Grandfather    Colon cancer Neg Hx    Esophageal cancer Neg Hx    Rectal cancer Neg Hx    Social History   Socioeconomic History   Marital status: Divorced    Spouse name: Not on file   Number of children: 1   Years of education: Not on file   Highest education level: Not on file  Occupational History   Occupation: retired  Tobacco Use   Smoking status: Never   Smokeless tobacco: Never  Vaping Use   Vaping Use: Never used  Substance and Sexual Activity   Alcohol use: No   Drug use: Never   Sexual activity: Never  Other Topics Concern   Not on file  Social History Narrative   Not on file   Social Determinants of Health   Financial Resource Strain: Low Risk  (05/30/2022)   Overall Financial Resource Strain (CARDIA)    Difficulty of Paying Living Expenses: Not hard at all  Food Insecurity: No Food Insecurity (05/30/2022)   Hunger Vital Sign    Worried About Running Out of Food in the Last Year: Never true    [place] in the Last Year: Never true  Transportation Needs: No Transportation Needs (05/30/2022)   PRAPARE - Hydrologist (Medical): No    Lack of Transportation (Non-Medical): No  Physical Activity: Insufficiently Active (05/30/2022)   Exercise Vital Sign    Days of Exercise per Week: 3 days    Minutes of Exercise per Session: 30 min  Stress: No Stress Concern Present (05/30/2022)   [place]    Feeling of Stress : Not at all  Social Connections: Moderately Isolated (05/30/2022)   Social Connection and Isolation Panel [NHANES]    Frequency of Communication with Friends and Family: More  than three times a week    Frequency of Social Gatherings with Friends and Family: More than three times a week    Attends Religious Services: More than 4 times per year    Active Member of [employer] or Organizations: No    Attends Archivist Meetings: Never    Marital Status: Divorced    Tobacco Counseling Counseling given: Not Answered   Clinical Intake:  Pre-visit preparation completed: Yes  Pain : No/denies pain     Nutritional Risks: None Diabetes: No  How often do you need to have someone help you when you read instructions, pamphlets, or other written materials from your doctor or pharmacy?: 1 - Never  Diabetic?no   Interpreter Needed?: No  Information entered by :: [name], LPN   Activities of Daily Living    05/30/2022    8:57 AM  In your present state of health, do you have any difficulty performing the following activities:  Hearing? 0  Vision? 0  Difficulty concentrating or making decisions? 0  Walking or climbing stairs? 0  Dressing or bathing? 0  Doing errands, shopping? 0  Preparing Food and eating ? N  Using the Toilet? N  In the past six months, have you accidently leaked urine? N  Do you have problems with loss of bowel control? N  Managing your Medications? N  Managing your Finances? N  Housekeeping or managing your Housekeeping? N    Patient Care Team: Libby Maw, MD as PCP - General (Family Medicine) Germaine Pomfret, [facility] as Pharmacist (Pharmacist)  Indicate any recent Medical Services you may have received from other than [facility] providers in the past year (date may be approximate).     Assessment:   This is a routine wellness examination for Shelia Roth.  Hearing/Vision screen Vision Screening - Comments:: Annual eye exams  Dietary issues and exercise activities discussed: Current Exercise Habits: Home exercise routine, Type of exercise: walking, Time (Minutes): 30, Frequency (Times/Week): 3, Weekly Exercise  (Minutes/Week): 90, Intensity: Mild, Exercise limited by: None identified   Goals Addressed             This Visit's Progress    Patient Stated   On track    Maintain healthy lifestyle        Depression Screen    05/30/2022    8:56 AM 05/18/2022   10:25 AM 11/01/2021   11:11 AM 09/19/2021   10:20 AM 07/19/2021    3:27 PM 05/24/2021    9:33 AM 12/01/2020    9:53 AM  PHQ 2/9 Scores  PHQ - 2 Score 0 0 0 0 0 0 0    Fall Risk    05/30/2022    8:54 AM 05/18/2022   10:25 AM 11/01/2021   11:11 AM 09/19/2021   10:20 AM 07/19/2021    3:27 PM  Fall Risk   Falls in the past year? 0 0 0 0 0  Number falls in past yr: 0 0 0 0 0  Injury with Fall? 0      Risk for fall due to : No Fall Risks      Follow up Falls prevention discussed        [place]:  Any stairs in or around the home? No  If so, are there any without handrails? No  Home free of loose throw rugs in walkways, pet beds, electrical cords, etc? Yes  Adequate lighting in your home to reduce risk of falls? Yes   ASSISTIVE DEVICES UTILIZED TO PREVENT FALLS:  Life alert? No  Use of a cane, walker or w/c? No  Grab bars in the bathroom? Yes  Shower chair or bench in shower? Yes  Elevated toilet seat or a handicapped toilet? Yes      05/30/2022    8:57 AM  6CIT Screen  What Year? 0 points  What month? 0 points  What time? 0 points  Count back from 20 0 points  Months in reverse 0 points  Repeat phrase  0 points  Total Score 0 points    Immunizations Immunization History  Administered Date(s) Administered   19-influenza Whole 06/30/2021   Influenza, High Dose Seasonal PF 08/25/2018   Influenza, Quadrivalent, Recombinant, Inj, Pf 06/01/2019   Influenza-Unspecified 06/02/2013, 05/21/2017, 08/21/2018, 06/01/2019   PFIZER(Purple Top)SARS-COV-2 Vaccination 10/17/2019, 11/12/2019, 05/30/2020, 06/30/2021   Pneumococcal Conjugate-13 12/13/2014   Pneumococcal Polysaccharide-23  10/29/2012   Zoster Recombinat (Shingrix) 06/01/2019, 06/01/2019, 06/09/2020    TDAP status: Due, Education has been provided regarding the importance of this vaccine. Advised may receive this vaccine at local pharmacy or Health Dept. Aware to provide a copy of the vaccination record if obtained from local pharmacy or Health Dept. Verbalized acceptance and understanding.  Flu Vaccine status: Due, Education has been provided regarding the importance of this vaccine. Advised may receive this vaccine at local pharmacy or Health Dept. Aware to provide a copy of the vaccination record if obtained from local pharmacy or Health Dept. Verbalized acceptance and understanding.  Pneumococcal vaccine status: Up to date  Covid-19 vaccine status: Completed vaccines  Qualifies for Shingles Vaccine? Yes   Zostavax completed No   Shingrix Completed?: Yes  Screening Tests Health Maintenance  Topic Date Due   TETANUS/TDAP  11/02/2022 (Originally 03/14/1965)   COLONOSCOPY (Pts 45-26yr Insurance coverage will need to be confirmed)  10/06/2023   Pneumonia Vaccine 75 Years old  Completed   DEXA SCAN  Completed   Zoster Vaccines- Shingrix  Completed   HPV VACCINES  Aged Out   INFLUENZA VACCINE  Discontinued   COVID-19 Vaccine  Discontinued   Hepatitis C Screening  Discontinued    Health Maintenance  There are no preventive care reminders to display for this patient.  Colorectal cancer screening: No longer required.   Mammogram status: No longer required due to age.  Bone Density status: Completed 06/30/2021. Results reflect: Bone density results: OSTEOPENIA. Repeat every 5 years.  Lung Cancer Screening: (Low Dose CT Chest recommended if Age 75-80years, 30 pack-year currently smoking OR have quit w/in 15years.) does not qualify.   Lung Cancer Screening Referral: n/a  Additional Screening:  Hepatitis C Screening: does not qualify;  Vision Screening: Recommended annual ophthalmology exams for  early detection of glaucoma and other disorders of the eye. Is the patient up to date with their annual eye exam?  Yes  Who is the provider or what is the name of the office in which the patient attends annual eye exams? Dr. HDesma Mcgregor If pt is not established with a provider, would they like to be referred to a provider to establish care? No .   Dental Screening: Recommended annual dental exams for proper oral hygiene  Community Resource Referral / Chronic Care Management: CRR required this visit?  No   CCM required this visit?  No      Plan:     I have personally reviewed and noted the following in the patient's chart:   Medical and social history Use of alcohol, tobacco or illicit drugs  Current medications and supplements including opioid prescriptions. Patient is not currently taking opioid prescriptions. Functional ability and status Nutritional status Physical activity Advanced directives List of other physicians Hospitalizations, surgeries, and ER visits in previous 12 months Vitals Screenings to include cognitive, depression, and falls Referrals and appointments  In addition, I have reviewed and discussed with patient certain preventive protocols, quality metrics, and best practice recommendations. A written personalized care plan for preventive services as well as general preventive health recommendations were provided to  patient.     Daphane Shepherd, LPN   [id number]   Nurse Notes: Due TDAP vaccine

## 2022-05-30 NOTE — Patient Instructions (Signed)
Ms. Shelia Roth , Thank you for taking time to come for your Medicare Wellness Visit. I appreciate your ongoing commitment to your health goals. Please review the following plan we discussed and let me know if I can assist you in the future.   These are the goals we discussed:  Goals      Increase physical activity     Patient Stated     Maintain healthy lifestyle      Track and Manage My Blood Pressure-Hypertension     Timeframe:  Long-Range Goal Priority:  High Start Date:  06/27/2021                           Expected End Date:  06/27/2022                     Follow Up within 30 days   - check blood pressure weekly    Why is this important?   You won't feel high blood pressure, but it can still hurt your blood vessels.  High blood pressure can cause heart or kidney problems. It can also cause a stroke.  Making lifestyle changes like losing a little weight or eating less salt will help.  Checking your blood pressure at home and at different times of the day can help to control blood pressure.  If the doctor prescribes medicine remember to take it the way the doctor ordered.  Call the office if you cannot afford the medicine or if there are questions about it.     Notes:         This is a list of the screening recommended for you and due dates:  Health Maintenance  Topic Date Due   Tetanus Vaccine  11/02/2022*   Colon Cancer Screening  10/06/2023   Pneumonia Vaccine  Completed   DEXA scan (bone density measurement)  Completed   Zoster (Shingles) Vaccine  Completed   HPV Vaccine  Aged Out   Flu Shot  Discontinued   COVID-19 Vaccine  Discontinued   Hepatitis C Screening: USPSTF Recommendation to screen - Ages 10-79 yo.  Discontinued  *Topic was postponed. The date shown is not the original due date.    Advanced directives: Please bring a copy of your health care power of attorney and living will to the office to be added to your chart at your convenience.    Conditions/risks identified: Aim for 30 minutes of exercise or brisk walking, 6-8 glasses of water, and 5 servings of fruits and vegetables each day.   Next appointment: Follow up in one year for your annual wellness visit    Preventive Care 65 Years and Older, Female Preventive care refers to lifestyle choices and visits with your health care provider that can promote health and wellness. What does preventive care include? A yearly physical exam. This is also called an annual well check. Dental exams once or twice a year. Routine eye exams. Ask your health care provider how often you should have your eyes checked. Personal lifestyle choices, including: Daily care of your teeth and gums. Regular physical activity. Eating a healthy diet. Avoiding tobacco and drug use. Limiting alcohol use. Practicing safe sex. Taking low-dose aspirin every day. Taking vitamin and mineral supplements as recommended by your health care provider. What happens during an annual well check? The services and screenings done by your health care provider during your annual well check will depend on your age, overall health,  lifestyle risk factors, and family history of disease. Counseling  Your health care provider may ask you questions about your: Alcohol use. Tobacco use. Drug use. Emotional well-being. Home and relationship well-being. Sexual activity. Eating habits. History of falls. Memory and ability to understand (cognition). Work and work Statistician. Reproductive health. Screening  You may have the following tests or measurements: Height, weight, and BMI. Blood pressure. Lipid and cholesterol levels. These may be checked every 5 years, or more frequently if you are over 49 years old. Skin check. Lung cancer screening. You may have this screening every year starting at age 20 if you have a 30-pack-year history of smoking and currently smoke or have quit within the past 15 years. Fecal  occult blood test (FOBT) of the stool. You may have this test every year starting at age 55. Flexible sigmoidoscopy or colonoscopy. You may have a sigmoidoscopy every 5 years or a colonoscopy every 10 years starting at age 64. Hepatitis C blood test. Hepatitis B blood test. Sexually transmitted disease (STD) testing. Diabetes screening. This is done by checking your blood sugar (glucose) after you have not eaten for a while (fasting). You may have this done every 1-3 years. Bone density scan. This is done to screen for osteoporosis. You may have this done starting at age 66. Mammogram. This may be done every 1-2 years. Talk to your health care provider about how often you should have regular mammograms. Talk with your health care provider about your test results, treatment options, and if necessary, the need for more tests. Vaccines  Your health care provider may recommend certain vaccines, such as: Influenza vaccine. This is recommended every year. Tetanus, diphtheria, and acellular pertussis (Tdap, Td) vaccine. You may need a Td booster every 10 years. Zoster vaccine. You may need this after age 36. Pneumococcal 13-valent conjugate (PCV13) vaccine. One dose is recommended after age 2. Pneumococcal polysaccharide (PPSV23) vaccine. One dose is recommended after age 48. Talk to your health care provider about which screenings and vaccines you need and how often you need them. This information is not intended to replace advice given to you by your health care provider. Make sure you discuss any questions you have with your health care provider. Document Released: 09/03/2015 Document Revised: 04/26/2016 Document Reviewed: 06/08/2015 Elsevier Interactive Patient Education  2017 [place] Prevention in the Home Falls can cause injuries. They can happen to people of all ages. There are many things you can do to make your home safe and to help prevent falls. What can I do on the outside  of my home? Regularly fix the edges of walkways and driveways and fix any cracks. Remove anything that might make you trip as you walk through a door, such as a raised step or threshold. Trim any bushes or trees on the path to your home. Use bright outdoor lighting. Clear any walking paths of anything that might make someone trip, such as rocks or tools. Regularly check to see if handrails are loose or broken. Make sure that both sides of any steps have handrails. Any raised decks and porches should have guardrails on the edges. Have any leaves, snow, or ice cleared regularly. Use sand or salt on walking paths during winter. Clean up any spills in your garage right away. This includes oil or grease spills. What can I do in the bathroom? Use night lights. Install grab bars by the toilet and in the tub and shower. Do not use towel bars as  grab bars. Use non-skid mats or decals in the tub or shower. If you need to sit down in the shower, use a plastic, non-slip stool. Keep the floor dry. Clean up any water that spills on the floor as soon as it happens. Remove soap buildup in the tub or shower regularly. Attach bath mats securely with double-sided non-slip rug tape. Do not have throw rugs and other things on the floor that can make you trip. What can I do in the bedroom? Use night lights. Make sure that you have a light by your bed that is easy to reach. Do not use any sheets or blankets that are too big for your bed. They should not hang down onto the floor. Have a firm chair that has side arms. You can use this for support while you get dressed. Do not have throw rugs and other things on the floor that can make you trip. What can I do in the kitchen? Clean up any spills right away. Avoid walking on wet floors. Keep items that you use a lot in easy-to-reach places. If you need to reach something above you, use a strong step stool that has a grab bar. Keep electrical cords out of the  way. Do not use floor polish or wax that makes floors slippery. If you must use wax, use non-skid floor wax. Do not have throw rugs and other things on the floor that can make you trip. What can I do with my stairs? Do not leave any items on the stairs. Make sure that there are handrails on both sides of the stairs and use them. Fix handrails that are broken or loose. Make sure that handrails are as long as the stairways. Check any carpeting to make sure that it is firmly attached to the stairs. Fix any carpet that is loose or worn. Avoid having throw rugs at the top or bottom of the stairs. If you do have throw rugs, attach them to the floor with carpet tape. Make sure that you have a light switch at the top of the stairs and the bottom of the stairs. If you do not have them, ask someone to add them for you. What else can I do to help prevent falls? Wear shoes that: Do not have high heels. Have rubber bottoms. Are comfortable and fit you well. Are closed at the toe. Do not wear sandals. If you use a stepladder: Make sure that it is fully opened. Do not climb a closed stepladder. Make sure that both sides of the stepladder are locked into place. Ask someone to hold it for you, if possible. Clearly mark and make sure that you can see: Any grab bars or handrails. First and last steps. Where the edge of each step is. Use tools that help you move around (mobility aids) if they are needed. These include: Canes. Walkers. Scooters. Crutches. Turn on the lights when you go into a dark area. Replace any light bulbs as soon as they burn out. Set up your furniture so you have a clear path. Avoid moving your furniture around. If any of your floors are uneven, fix them. If there are any pets around you, be aware of where they are. Review your medicines with your doctor. Some medicines can make you feel dizzy. This can increase your chance of falling. Ask your doctor what other things that you can  do to help prevent falls. This information is not intended to replace advice given to you by  your health care provider. Make sure you discuss any questions you have with your health care provider. Document Released: 06/03/2009 Document Revised: 01/13/2016 Document Reviewed: 09/11/2014 Elsevier Interactive Patient Education  2017 [employer].

## 2022-06-08 ENCOUNTER — Telehealth: Payer: Self-pay

## 2022-06-08 NOTE — Progress Notes (Signed)
Chronic Care Management Pharmacy Assistant   Name: Shelia Roth  MRN: [id number] DOB: 04/30/1946  Reason for Encounter: Medication Review/Medication Coordination Call.   Recent office visits:  05/30/2022 Shelia Pierini LPN (PCP Office) No Medication Changes noted, Medicare Wellness completed. 05/18/2022 Dr. Ethelene Hal MD (PCP) No Medication Changes noted,Return in about 6 months  Recent consult visits:  None ID  Hospital visits:  None in previous 6 months  Medications: Outpatient Encounter Medications as of 06/08/2022  Medication Sig Note   alendronate (FOSAMAX) 70 MG tablet TAKE ONE TABLET BY MOUTH ONCE WEEKLY BEFORE BREAKFAST ON THURSDAYS    atorvastatin (LIPITOR) 10 MG tablet TAKE ONE TABLET BY MOUTH BEFORE BREAKFAST    Calcium Carb-Cholecalciferol (CALCIUM + VITAMIN D3 PO) Take 1 tablet by mouth 2 (two) times daily. Calcium '600mg'$  and Vitamin d3 74mg    carvedilol (COREG) 12.5 MG tablet TAKE ONE TABLET BY MOUTH AT BREAKFAST AND AT BEDTIME    diclofenac Sodium (VOLTAREN) 1 % GEL Apply 2 g topically 4 (four) times daily as needed.    ELIQUIS 5 MG TABS tablet TAKE ONE TABLET BY MOUTH EVERY MORNING and TAKE ONE TABLET BY MOUTH EVERYDAY AT BEDTIME    ferrous sulfate 324 MG TBEC Take 324 mg by mouth.    flecainide (TAMBOCOR) 50 MG tablet Take 50 mg by mouth 2 (two) times daily. 08/19/2020: [facility]   losartan (COZAAR) 50 MG tablet Take 1 tablet (50 mg total) by mouth daily.    mometasone (NASONEX) 50 MCG/ACT nasal spray USE TWO SPRAYS IN EACH NOSTRIL DAILY    Multiple Vitamins-Minerals (EMERGEN-C IMMUNE PLUS) PACK Take 1 Package by mouth daily.    No facility-administered encounter medications on file as of 06/08/2022.    Care Gaps: None ID   Star Rating Drugs: Atorvastatin 10 mg last filled on 05/12/2022 [facility]. Losartan 50 mg last filled on 05/12/2022 [facility].   Medication  Fill Gaps: None ID  Reviewed chart for medication changes ahead of medication coordination call.  BP Readings from Last 3 Encounters:  05/18/22 122/70  11/01/21 124/68  09/19/21 110/66    No results found for: "HGBA1C"   Patient obtains medications through Adherence Packaging  30 Days   Last adherence delivery included:  Alendronate (FosaMax) 70 Mg Tablet Weekly on Thursdays - Before Breakfast Eliquis 5 MG Tablet Twice daily- Breakfast and bedtime Carvedilol 12.'5mg'$  Tablet Twice daily- Breakfast and bedtime Losartan 50 mg Tablet - Breakfast Flecainide 50 mg tablet twice daily - Breakfast and bedtime Atorvastatin 10 mg one tablet daily- before breakfast  Patient declined medication last month: Mometasone (Nasonex) 50 MCG/ACT Nasal spray - 2 sprays daily (Adequate supply) Voltaren 1% Gel PRN- (adequate Supply) Calcium + Vit D (Adequate Supply - OTC) Emergen-C Immune Plus (Adequate Supply - OTC) Ocuvite-Luteine Multivitamin (Adequate Supply - OTC)  Patient is due for next adherence delivery on: 06/19/2022. Called patient and reviewed medications and coordinated delivery.  This delivery to include: Alendronate (FosaMax) 70 Mg Tablet Weekly on Thursdays - Before Breakfast Eliquis 5 MG Tablet Twice daily- Breakfast and bedtime Carvedilol 12.'5mg'$  Tablet Twice daily- Breakfast and bedtime Losartan 50 mg Tablet - Breakfast Flecainide 50 mg tablet twice daily - Breakfast and bedtime Atorvastatin 10 mg one tablet daily- before breakfast   Patient declined the following medications: Mometasone (Nasonex) 50 MCG/ACT Nasal spray - 2 sprays daily (Adequate supply) Voltaren 1% Gel PRN- (adequate Supply) Calcium +  Vit D (Adequate Supply - OTC) Emergen-C Immune Plus (Adequate Supply - OTC) Ocuvite-Luteine Multivitamin (Adequate Supply - OTC)  Patient needs refills for None ID.  Confirmed delivery date of 06/19/2022 ([facility]), advised patient that pharmacy will contact them the  morning of delivery.   [place] Pharmacist Assistant [phone]

## 2022-07-03 ENCOUNTER — Other Ambulatory Visit: Payer: Self-pay | Admitting: Family Medicine

## 2022-07-03 DIAGNOSIS — I10 Essential (primary) hypertension: Secondary | ICD-10-CM

## 2022-07-05 ENCOUNTER — Telehealth: Payer: Self-pay

## 2022-07-05 NOTE — Progress Notes (Signed)
Chronic Care Management Pharmacy Assistant   Name: Shelia Roth  MRN: [id number] DOB: 1946/05/17  Reason for Encounter: Medication Review/Medication Coordination Call.   Recent office visits:  None ID  Recent consult visits:  None ID  Hospital visits:  None in previous 6 months  Medications: Outpatient Encounter Medications as of 07/05/2022  Medication Sig Note   alendronate (FOSAMAX) 70 MG tablet TAKE ONE TABLET BY MOUTH ONCE WEEKLY BEFORE BREAKFAST ON THURSDAYS    atorvastatin (LIPITOR) 10 MG tablet TAKE ONE TABLET BY MOUTH BEFORE BREAKFAST    Calcium Carb-Cholecalciferol (CALCIUM + VITAMIN D3 PO) Take 1 tablet by mouth 2 (two) times daily. Calcium '600mg'$  and Vitamin d3 36mg    carvedilol (COREG) 12.5 MG tablet TAKE ONE TABLET BY MOUTH AT BREAKFAST AND AT BEDTIME    diclofenac Sodium (VOLTAREN) 1 % GEL Apply 2 g topically 4 (four) times daily as needed.    ELIQUIS 5 MG TABS tablet TAKE ONE TABLET BY MOUTH EVERY MORNING and TAKE ONE TABLET BY MOUTH EVERYDAY AT BEDTIME    ferrous sulfate 324 MG TBEC Take 324 mg by mouth.    flecainide (TAMBOCOR) 50 MG tablet Take 50 mg by mouth 2 (two) times daily. 08/19/2020: [facility] KB[facility]   losartan (COZAAR) 50 MG tablet TAKE ONE TABLET BY MOUTH ONCE DAILY    mometasone (NASONEX) 50 MCG/ACT nasal spray USE TWO SPRAYS IN EACH NOSTRIL DAILY    Multiple Vitamins-Minerals (EMERGEN-C IMMUNE PLUS) PACK Take 1 Package by mouth daily.    No facility-administered encounter medications on file as of 07/05/2022.    Care Gaps: None ID   Star Rating Drugs: Atorvastatin 10 mg last filled on 06/13/2022 [facility]. Losartan 50 mg last filled on 06/13/2022 [facility].  Reviewed chart for medication changes ahead of medication coordination call.   BP Readings from Last 3 Encounters:  05/18/22 122/70  11/01/21 124/68  09/19/21 110/66    No results found  for: "HGBA1C"   Patient obtains medications through Adherence Packaging  30 Days   Last adherence delivery included:  Alendronate (FosaMax) 70 Mg Tablet Weekly on Thursdays - Before Breakfast Eliquis 5 MG Tablet Twice daily- Breakfast and bedtime Carvedilol 12.'5mg'$  Tablet Twice daily- Breakfast and bedtime Losartan 50 mg Tablet - Breakfast Flecainide 50 mg tablet twice daily - Breakfast and bedtime Atorvastatin 10 mg one tablet daily- before breakfast   Patient declined medications  last month: Mometasone (Nasonex) 50 MCG/ACT Nasal spray - 2 sprays daily (Adequate supply) Voltaren 1% Gel PRN- (adequate Supply) Calcium + Vit D (Adequate Supply - OTC) Emergen-C Immune Plus (Adequate Supply - OTC) Ocuvite-Luteine Multivitamin (Adequate Supply - OTC)  Patient is due for next adherence delivery on: 07/18/2022. Called patient and reviewed medications and coordinated delivery.  This delivery to include: Alendronate (FosaMax) 70 Mg Tablet Weekly on Thursdays - Before Breakfast Eliquis 5 MG Tablet Twice daily- Breakfast and bedtime Carvedilol 12.'5mg'$  Tablet Twice daily- Breakfast and bedtime Losartan 50 mg Tablet - Breakfast Flecainide 50 mg tablet twice daily - Breakfast and bedtime Atorvastatin 10 mg one tablet daily- before breakfast  Patient declined the following medications: Mometasone (Nasonex) 50 MCG/ACT Nasal spray - 2 sprays daily (Adequate supply) Voltaren 1% Gel PRN- (adequate Supply) Calcium + Vit D (Adequate Supply - OTC) Emergen-C Immune Plus (Adequate Supply - OTC) Ocuvite-Luteine Multivitamin (Adequate Supply - OTC)  Patient needs refills for None ID.  Confirmed delivery date  of 07/18/2022 ([facility]), advised patient that pharmacy will contact them the morning of delivery.  [place] Pharmacist Assistant [phone]

## 2022-08-03 ENCOUNTER — Telehealth: Payer: Self-pay

## 2022-08-03 NOTE — Progress Notes (Signed)
Chronic Care Management Pharmacy Assistant   Name: Shelia Roth  MRN: [id number] DOB: February 18, 1946  Reason for Encounter: Medication Review/Medication Coordination Call.   Recent office visits:  None ID  Recent consult visits:  None ID  Hospital visits:  None in previous 6 months  Medications: Outpatient Encounter Medications as of 08/03/2022  Medication Sig Note   alendronate (FOSAMAX) 70 MG tablet TAKE ONE TABLET BY MOUTH ONCE WEEKLY BEFORE BREAKFAST ON THURSDAYS    atorvastatin (LIPITOR) 10 MG tablet TAKE ONE TABLET BY MOUTH BEFORE BREAKFAST    Calcium Carb-Cholecalciferol (CALCIUM + VITAMIN D3 PO) Take 1 tablet by mouth 2 (two) times daily. Calcium '600mg'$  and Vitamin d3 58mg    carvedilol (COREG) 12.5 MG tablet TAKE ONE TABLET BY MOUTH AT BREAKFAST AND AT BEDTIME    diclofenac Sodium (VOLTAREN) 1 % GEL Apply 2 g topically 4 (four) times daily as needed.    ELIQUIS 5 MG TABS tablet TAKE ONE TABLET BY MOUTH EVERY MORNING and TAKE ONE TABLET BY MOUTH EVERYDAY AT BEDTIME    ferrous sulfate 324 MG TBEC Take 324 mg by mouth.    flecainide (TAMBOCOR) 50 MG tablet Take 50 mg by mouth 2 (two) times daily. 08/19/2020: [facility]   losartan (COZAAR) 50 MG tablet TAKE ONE TABLET BY MOUTH ONCE DAILY    mometasone (NASONEX) 50 MCG/ACT nasal spray USE TWO SPRAYS IN EACH NOSTRIL DAILY    Multiple Vitamins-Minerals (EMERGEN-C IMMUNE PLUS) PACK Take 1 Package by mouth daily.    No facility-administered encounter medications on file as of 08/03/2022.    Care Gaps: DTAP Vaccine   Star Rating Drugs: Atorvastatin 10 mg last filled on 07/11/2022 [facility]. Losartan 50 mg last filled on 07/11/2022 [facility].  Reviewed chart for medication changes ahead of medication coordination call.  BP Readings from Last 3 Encounters:  05/18/22 122/70  11/01/21 124/68  09/19/21 110/66    No results found  for: "HGBA1C"   Patient obtains medications through Adherence Packaging  30 Days   Last adherence delivery included:  Alendronate (FosaMax) 70 Mg Tablet Weekly on Thursdays - Before Breakfast Eliquis 5 MG Tablet Twice daily- Breakfast and bedtime Carvedilol 12.'5mg'$  Tablet Twice daily- Breakfast and bedtime Losartan 50 mg Tablet - Breakfast Flecainide 50 mg tablet twice daily - Breakfast and bedtime Atorvastatin 10 mg one tablet daily- before breakfast  Patient declined medications last month: Mometasone (Nasonex) 50 MCG/ACT Nasal spray - 2 sprays daily (Adequate supply) Voltaren 1% Gel PRN- (adequate Supply) Calcium + Vit D (Adequate Supply - OTC) Emergen-C Immune Plus (Adequate Supply - OTC) Ocuvite-Luteine Multivitamin (Adequate Supply - OTC)  Patient is due for next adherence delivery on: 08/16/2022. Called patient and reviewed medications and coordinated delivery.  This delivery to include: Alendronate (FosaMax) 70 Mg Tablet Weekly on Thursdays - Before Breakfast Eliquis 5 MG Tablet Twice daily- Breakfast and bedtime Carvedilol 12.'5mg'$  Tablet Twice daily- Breakfast and bedtime Losartan 50 mg Tablet - Breakfast Flecainide 50 mg tablet twice daily - Breakfast and bedtime Atorvastatin 10 mg one tablet daily- before breakfast  Patient declined the following medications: Mometasone (Nasonex) 50 MCG/ACT Nasal spray - 2 sprays daily (Adequate supply) Voltaren 1% Gel PRN- (adequate Supply) Calcium + Vit D (Adequate Supply - OTC) Emergen-C Immune Plus (Adequate Supply - OTC) Ocuvite-Luteine Multivitamin (Adequate Supply - OTC)  Patient needs refills for None ID.  Confirmed delivery date of 08/16/2022 ([facility]), advised patient that pharmacy will contact them the morning of delivery.  [place] Pharmacist Assistant [phone]

## 2022-09-05 ENCOUNTER — Telehealth: Payer: Self-pay

## 2022-09-05 DIAGNOSIS — I480 Paroxysmal atrial fibrillation: Secondary | ICD-10-CM

## 2022-09-05 NOTE — Progress Notes (Signed)
Care Management & Coordination Services Pharmacy Team  Reason for Encounter: Medication coordination and delivery  Contacted patient on 09/05/2022 to discuss medications   Recent office visits:  None ID  Recent consult visits:  None ID  Hospital visits:  None in previous 6 months  Medications: Outpatient Encounter Medications as of 09/05/2022  Medication Sig Note   alendronate (FOSAMAX) 70 MG tablet TAKE ONE TABLET BY MOUTH ONCE WEEKLY BEFORE BREAKFAST ON THURSDAYS    atorvastatin (LIPITOR) 10 MG tablet TAKE ONE TABLET BY MOUTH BEFORE BREAKFAST    Calcium Carb-Cholecalciferol (CALCIUM + VITAMIN D3 PO) Take 1 tablet by mouth 2 (two) times daily. Calcium '600mg'$  and Vitamin d3 12mg    carvedilol (COREG) 12.5 MG tablet TAKE ONE TABLET BY MOUTH AT BREAKFAST AND AT BEDTIME    diclofenac Sodium (VOLTAREN) 1 % GEL Apply 2 g topically 4 (four) times daily as needed.    ELIQUIS 5 MG TABS tablet TAKE ONE TABLET BY MOUTH EVERY MORNING and TAKE ONE TABLET BY MOUTH EVERYDAY AT BEDTIME    ferrous sulfate 324 MG TBEC Take 324 mg by mouth.    flecainide (TAMBOCOR) 50 MG tablet Take 50 mg by mouth 2 (two) times daily. 08/19/2020: [facility]   losartan (COZAAR) 50 MG tablet TAKE ONE TABLET BY MOUTH ONCE DAILY    mometasone (NASONEX) 50 MCG/ACT nasal spray USE TWO SPRAYS IN EACH NOSTRIL DAILY    Multiple Vitamins-Minerals (EMERGEN-C IMMUNE PLUS) PACK Take 1 Package by mouth daily.    No facility-administered encounter medications on file as of 09/05/2022.   BP Readings from Last 3 Encounters:  05/18/22 122/70  11/01/21 124/68  09/19/21 110/66    Pulse Readings from Last 3 Encounters:  05/18/22 60  11/01/21 66  09/19/21 76    No results found for: "HGBA1C" Lab Results  Component Value Date   CREATININE 0.85 05/18/2022   BUN 24 (H) 05/18/2022   GFR 67.13 05/18/2022   NA 141 05/18/2022   K 3.7 05/18/2022   CALCIUM 9.4 05/18/2022   CO2 28 05/18/2022     Care Gaps: DTAP Vaccine   Star Rating Drugs: Atorvastatin 10 mg last filled on 07/11/2022 [facility]. Losartan 50 mg last filled on 07/11/2022 [facility].  Last adherence delivery date:08/16/2022      Patient is due for next adherence delivery on: 09/15/2022  Spoke with patient on 09/05/2022 reviewed medications and coordinated delivery.  This delivery to include: Adherence Packaging  30 Days  Alendronate (FosaMax) 70 Mg Tablet Weekly on Thursdays - Before Breakfast Eliquis 5 MG Tablet Twice daily- Breakfast and bedtime Carvedilol 12.'5mg'$  Tablet Twice daily- Breakfast and bedtime Losartan 50 mg Tablet - Breakfast Flecainide 50 mg tablet twice daily - Breakfast and bedtime Atorvastatin 10 mg one tablet daily- before breakfast  Patient declined the following medications this month: Mometasone (Nasonex) 50 MCG/ACT Nasal spray - 2 sprays daily (Adequate supply) Voltaren 1% Gel PRN- (adequate Supply) Calcium + Vit D (Adequate Supply - OTC) Emergen-C Immune Plus (Adequate Supply - OTC) Ocuvite-Luteine Multivitamin (Adequate Supply - OTC)  Refills requested from providers include: Eliquis prescribe by PCP- task sent to [facility] to send in refill.  Confirmed delivery date of 09/15/2022 ([facility]), advised patient that pharmacy will contact them the morning of delivery.   Any concerns about your medications? No  How often do you forget or accidentally miss a dose? Never  Do you use  a pillbox? No  Is patient in packaging Yes   Recent blood pressure readings are as follows:None ID  Recent blood glucose readings are as follows:None ID   Cycle dispensing form sent to Daron Offer for review.   [place] Pharmacist Assistant [phone]

## 2022-09-06 MED ORDER — APIXABAN 5 MG PO TABS: 5.0000 mg | ORAL_TABLET | Freq: Two times a day (BID) | ORAL | 1 refills | Status: DC

## 2022-09-06 NOTE — Addendum Note (Signed)
Addended by: Daron Offer A on: 09/06/2022 09:32 AM   Modules accepted: Orders

## 2022-10-11 ENCOUNTER — Other Ambulatory Visit: Payer: Self-pay | Admitting: Family Medicine

## 2022-10-11 DIAGNOSIS — M8000XS Age-related osteoporosis with current pathological fracture, unspecified site, sequela: Secondary | ICD-10-CM

## 2022-10-12 NOTE — Telephone Encounter (Signed)
Call from [facility] w/[facility] to refill alendronate.

## 2022-11-09 ENCOUNTER — Other Ambulatory Visit: Payer: Self-pay | Admitting: Family Medicine

## 2022-11-09 DIAGNOSIS — I519 Heart disease, unspecified: Secondary | ICD-10-CM

## 2022-11-09 DIAGNOSIS — I480 Paroxysmal atrial fibrillation: Secondary | ICD-10-CM

## 2022-11-09 DIAGNOSIS — I10 Essential (primary) hypertension: Secondary | ICD-10-CM

## 2022-11-16 ENCOUNTER — Encounter: Payer: Self-pay | Admitting: Family Medicine

## 2022-11-16 ENCOUNTER — Ambulatory Visit (INDEPENDENT_AMBULATORY_CARE_PROVIDER_SITE_OTHER): Payer: Medicare Other | Admitting: Family Medicine

## 2022-11-16 VITALS — BP 130/90 | HR 97 | Temp 97.7°F | Ht 64.0 in | Wt 168.2 lb

## 2022-11-16 DIAGNOSIS — E7800 Pure hypercholesterolemia, unspecified: Secondary | ICD-10-CM | POA: Diagnosis not present

## 2022-11-16 DIAGNOSIS — I10 Essential (primary) hypertension: Secondary | ICD-10-CM | POA: Diagnosis not present

## 2022-11-16 DIAGNOSIS — E611 Iron deficiency: Secondary | ICD-10-CM

## 2022-11-16 LAB — LDL CHOLESTEROL, DIRECT: Direct LDL: 76 mg/dL

## 2022-11-16 LAB — COMPREHENSIVE METABOLIC PANEL
ALT: 11 U/L (ref 0–35)
AST: 17 U/L (ref 0–37)
Albumin: 3.9 g/dL (ref 3.5–5.2)
Alkaline Phosphatase: 69 U/L (ref 39–117)
BUN: 17 mg/dL (ref 6–23)
CO2: 31 mEq/L (ref 19–32)
Calcium: 9.6 mg/dL (ref 8.4–10.5)
Chloride: 105 mEq/L (ref 96–112)
Creatinine, Ser: 0.9 mg/dL (ref 0.40–1.20)
GFR: 62.46 mL/min (ref >60.00)
Glucose, Bld: 97 mg/dL (ref 70–99)
Potassium: 4.6 mEq/L (ref 3.5–5.1)
Sodium: 141 mEq/L (ref 135–145)
Total Bilirubin: 0.7 mg/dL (ref 0.2–1.2)
Total Protein: 6.6 g/dL (ref 6.0–8.3)

## 2022-11-16 LAB — CBC
HCT: 41.1 % (ref 36.0–46.0)
Hemoglobin: 13.8 g/dL (ref 12.0–15.0)
MCHC: 33.6 g/dL (ref 30.0–36.0)
MCV: 93 fl (ref 78.0–100.0)
Platelets: 246 10*3/uL (ref 150.0–400.0)
RBC: 4.42 Mil/uL (ref 3.87–5.11)
RDW: 13.7 % (ref 11.5–15.5)
WBC: 5.4 10*3/uL (ref 4.0–10.5)

## 2022-11-16 NOTE — Progress Notes (Signed)
Established Patient Office Visit   Subjective:  Patient ID: Shelia Roth, female    DOB: 24-Aug-1945  Age: 77 y.o. MRN: [id number]  Chief Complaint  Patient presents with   Hypertension    Hypertension Pertinent negatives include no blurred vision.   Encounter Diagnoses  Name Primary?   Essential hypertension Yes   Elevated LDL cholesterol level    Iron deficiency    For follow-up of above.  Doing well.  Continues to be the glue that holds her family together raising grandchildren and great-grandchildren.  She is exercising daily by walking and then coming home taking care of the house.  She has taken Fosamax since 2017.  Continues losartan 50 and carvedilol 12 and half twice daily for hypertension.  Continues Tambocor for atrial fibs with apixaban for anticoagulation.  She is scheduled for an extensive dental procedure and will need to hold her apixaban.   Review of Systems  Constitutional: Negative.   HENT: Negative.    Eyes:  Negative for blurred vision, discharge and redness.  Respiratory: Negative.    Cardiovascular: Negative.   Gastrointestinal:  Negative for abdominal pain.  Genitourinary: Negative.   Musculoskeletal: Negative.  Negative for myalgias.  Skin:  Negative for rash.  Neurological:  Negative for tingling, loss of consciousness and weakness.  Endo/Heme/Allergies:  Negative for polydipsia.     Current Outpatient Medications:    alendronate (FOSAMAX) 70 MG tablet, TAKE ONE TABLET BY MOUTH ONCE WEEKLY BEFORE BREAKFAST ON THURSDAYS, Disp: 12 tablet, Rfl: 1   apixaban (ELIQUIS) 5 MG TABS tablet, Take 1 tablet (5 mg total) by mouth 2 (two) times daily., Disp: 180 tablet, Rfl: 1   atorvastatin (LIPITOR) 10 MG tablet, TAKE ONE TABLET BY MOUTH BEFORE BREAKFAST, Disp: 90 tablet, Rfl: 3   Calcium Carb-Cholecalciferol (CALCIUM + VITAMIN D3 PO), Take 1 tablet by mouth 2 (two) times daily. Calcium 600mg  and Vitamin d3 43mcg, Disp: , Rfl:    carvedilol (COREG) 12.5 MG  tablet, TAKE ONE TABLET BY MOUTH EVERY MORNING and TAKE ONE TABLET BY MOUTH EVERYDAY AT BEDTIME, Disp: 180 tablet, Rfl: 1   diclofenac Sodium (VOLTAREN) 1 % GEL, Apply 2 g topically 4 (four) times daily as needed., Disp: , Rfl:    ferrous sulfate 324 MG TBEC, Take 324 mg by mouth., Disp: , Rfl:    losartan (COZAAR) 50 MG tablet, TAKE ONE TABLET BY MOUTH ONCE DAILY, Disp: 90 tablet, Rfl: 1   mometasone (NASONEX) 50 MCG/ACT nasal spray, USE TWO SPRAYS IN EACH NOSTRIL DAILY, Disp: 17 g, Rfl: 12   Multiple Vitamins-Minerals (EMERGEN-C IMMUNE PLUS) PACK, Take 1 Package by mouth daily., Disp: , Rfl:    flecainide (TAMBOCOR) 50 MG tablet, Take 50 mg by mouth 2 (two) times daily., Disp: , Rfl:    Objective:     BP (!) 130/90   Pulse 97   Temp 97.7 F (36.5 C)   Ht 5\' 4"  (1.626 m)   Wt 168 lb 3.2 oz (76.3 kg)   LMP  (LMP Unknown)   SpO2 97%   BMI 28.87 kg/m    Physical Exam Constitutional:      General: She is not in acute distress.    Appearance: Normal appearance. She is not ill-appearing, toxic-appearing or diaphoretic.  HENT:     Head: Normocephalic and atraumatic.     Right Ear: External ear normal.     Left Ear: External ear normal.     Mouth/Throat:     Mouth: Mucous membranes are  moist.     Pharynx: Oropharynx is clear. No oropharyngeal exudate or posterior oropharyngeal erythema.  Eyes:     General: No scleral icterus.       Right eye: No discharge.        Left eye: No discharge.     Extraocular Movements: Extraocular movements intact.     Conjunctiva/sclera: Conjunctivae normal.     Pupils: Pupils are equal, round, and reactive to light.  Cardiovascular:     Rate and Rhythm: Normal rate and regular rhythm.  Pulmonary:     Effort: Pulmonary effort is normal. No respiratory distress.     Breath sounds: Normal breath sounds.  Abdominal:     General: Bowel sounds are normal.  Musculoskeletal:     Cervical back: No rigidity or tenderness.  Skin:    General: Skin is warm  and dry.  Neurological:     Mental Status: She is alert and oriented to person, place, and time.  Psychiatric:        Mood and Affect: Mood normal.        Behavior: Behavior normal.      No results found for any visits on 11/16/22.    The 10-year ASCVD risk score (Arnett DK, et al., 2019) is: 21%    Assessment & Plan:   Essential hypertension -     CBC -     Comprehensive metabolic panel  Elevated LDL cholesterol level -     LDL cholesterol, direct  Iron deficiency -     CBC -     Iron, TIBC and Ferritin Panel    Return in about 6 months (around 05/19/2023).  Okayed patient to hold her Eliquis 2 days prior to her upcoming dental procedure and restarted on the day after.  There is a small but acceptable risk.  She is taking Fosamax for over 5 years.  Will consider discontinuing it.  DEXA scan in November 2022 however did show a T-score of -2.6.  Libby Maw, MD

## 2022-11-17 LAB — IRON,TIBC AND FERRITIN PANEL
%SAT: 43 % (calc) (ref 16–45)
Ferritin: 79 ng/mL (ref 16–288)
Iron: 125 ug/dL (ref 45–160)
TIBC: 288 mcg/dL (calc) (ref 250–450)

## 2022-11-30 ENCOUNTER — Other Ambulatory Visit: Payer: Self-pay | Admitting: Family Medicine

## 2022-11-30 DIAGNOSIS — E7800 Pure hypercholesterolemia, unspecified: Secondary | ICD-10-CM

## 2022-12-13 DIAGNOSIS — I480 Paroxysmal atrial fibrillation: Secondary | ICD-10-CM | POA: Diagnosis not present

## 2022-12-13 DIAGNOSIS — I10 Essential (primary) hypertension: Secondary | ICD-10-CM | POA: Diagnosis not present

## 2022-12-13 DIAGNOSIS — I447 Left bundle-branch block, unspecified: Secondary | ICD-10-CM | POA: Diagnosis not present

## 2022-12-14 DIAGNOSIS — I480 Paroxysmal atrial fibrillation: Secondary | ICD-10-CM | POA: Diagnosis not present

## 2023-01-08 ENCOUNTER — Other Ambulatory Visit: Payer: Self-pay | Admitting: Family Medicine

## 2023-01-08 DIAGNOSIS — I10 Essential (primary) hypertension: Secondary | ICD-10-CM

## 2023-01-09 DIAGNOSIS — I480 Paroxysmal atrial fibrillation: Secondary | ICD-10-CM | POA: Diagnosis not present

## 2023-01-11 DIAGNOSIS — I491 Atrial premature depolarization: Secondary | ICD-10-CM | POA: Diagnosis not present

## 2023-01-11 DIAGNOSIS — I480 Paroxysmal atrial fibrillation: Secondary | ICD-10-CM | POA: Diagnosis not present

## 2023-03-09 DIAGNOSIS — I4891 Unspecified atrial fibrillation: Secondary | ICD-10-CM | POA: Diagnosis not present

## 2023-03-09 DIAGNOSIS — I454 Nonspecific intraventricular block: Secondary | ICD-10-CM | POA: Diagnosis not present

## 2023-03-09 DIAGNOSIS — I499 Cardiac arrhythmia, unspecified: Secondary | ICD-10-CM | POA: Diagnosis not present

## 2023-03-09 DIAGNOSIS — I447 Left bundle-branch block, unspecified: Secondary | ICD-10-CM | POA: Diagnosis not present

## 2023-03-09 DIAGNOSIS — E559 Vitamin D deficiency, unspecified: Secondary | ICD-10-CM | POA: Diagnosis not present

## 2023-03-09 DIAGNOSIS — I480 Paroxysmal atrial fibrillation: Secondary | ICD-10-CM | POA: Diagnosis not present

## 2023-03-09 DIAGNOSIS — I10 Essential (primary) hypertension: Secondary | ICD-10-CM | POA: Diagnosis not present

## 2023-03-12 ENCOUNTER — Other Ambulatory Visit: Payer: Self-pay | Admitting: Family Medicine

## 2023-03-12 DIAGNOSIS — M8000XS Age-related osteoporosis with current pathological fracture, unspecified site, sequela: Secondary | ICD-10-CM

## 2023-03-12 DIAGNOSIS — I480 Paroxysmal atrial fibrillation: Secondary | ICD-10-CM

## 2023-04-05 ENCOUNTER — Encounter (INDEPENDENT_AMBULATORY_CARE_PROVIDER_SITE_OTHER): Payer: Self-pay

## 2023-04-21 ENCOUNTER — Other Ambulatory Visit: Payer: Self-pay | Admitting: Family Medicine

## 2023-04-21 DIAGNOSIS — I10 Essential (primary) hypertension: Secondary | ICD-10-CM

## 2023-04-21 DIAGNOSIS — I519 Heart disease, unspecified: Secondary | ICD-10-CM

## 2023-04-21 DIAGNOSIS — I480 Paroxysmal atrial fibrillation: Secondary | ICD-10-CM

## 2023-05-01 DIAGNOSIS — I480 Paroxysmal atrial fibrillation: Secondary | ICD-10-CM | POA: Diagnosis not present

## 2023-05-01 DIAGNOSIS — E049 Nontoxic goiter, unspecified: Secondary | ICD-10-CM | POA: Diagnosis not present

## 2023-05-01 DIAGNOSIS — K2289 Other specified disease of esophagus: Secondary | ICD-10-CM | POA: Diagnosis not present

## 2023-05-01 DIAGNOSIS — I700 Atherosclerosis of aorta: Secondary | ICD-10-CM | POA: Diagnosis not present

## 2023-05-01 DIAGNOSIS — K862 Cyst of pancreas: Secondary | ICD-10-CM | POA: Diagnosis not present

## 2023-05-01 DIAGNOSIS — R918 Other nonspecific abnormal finding of lung field: Secondary | ICD-10-CM | POA: Diagnosis not present

## 2023-05-01 DIAGNOSIS — I083 Combined rheumatic disorders of mitral, aortic and tricuspid valves: Secondary | ICD-10-CM | POA: Diagnosis not present

## 2023-05-02 DIAGNOSIS — E785 Hyperlipidemia, unspecified: Secondary | ICD-10-CM | POA: Diagnosis not present

## 2023-05-02 DIAGNOSIS — I480 Paroxysmal atrial fibrillation: Secondary | ICD-10-CM | POA: Diagnosis not present

## 2023-05-02 DIAGNOSIS — Z7901 Long term (current) use of anticoagulants: Secondary | ICD-10-CM | POA: Diagnosis not present

## 2023-05-02 DIAGNOSIS — I10 Essential (primary) hypertension: Secondary | ICD-10-CM | POA: Diagnosis not present

## 2023-05-03 DIAGNOSIS — I480 Paroxysmal atrial fibrillation: Secondary | ICD-10-CM | POA: Diagnosis not present

## 2023-05-03 DIAGNOSIS — E785 Hyperlipidemia, unspecified: Secondary | ICD-10-CM | POA: Diagnosis not present

## 2023-05-03 DIAGNOSIS — Z7901 Long term (current) use of anticoagulants: Secondary | ICD-10-CM | POA: Diagnosis not present

## 2023-05-03 DIAGNOSIS — I10 Essential (primary) hypertension: Secondary | ICD-10-CM | POA: Diagnosis not present

## 2023-05-05 HISTORY — PX: OTHER SURGICAL HISTORY: SHX169

## 2023-05-17 ENCOUNTER — Ambulatory Visit: Payer: Medicare Other | Admitting: Family Medicine

## 2023-05-21 ENCOUNTER — Encounter: Payer: Self-pay | Admitting: Family Medicine

## 2023-05-21 ENCOUNTER — Ambulatory Visit (INDEPENDENT_AMBULATORY_CARE_PROVIDER_SITE_OTHER): Payer: Medicare Other | Admitting: Family Medicine

## 2023-05-21 VITALS — BP 144/78 | HR 65 | Temp 97.6°F | Ht 64.0 in | Wt 164.8 lb

## 2023-05-21 DIAGNOSIS — Z23 Encounter for immunization: Secondary | ICD-10-CM | POA: Diagnosis not present

## 2023-05-21 DIAGNOSIS — K8689 Other specified diseases of pancreas: Secondary | ICD-10-CM | POA: Diagnosis not present

## 2023-05-21 DIAGNOSIS — E611 Iron deficiency: Secondary | ICD-10-CM

## 2023-05-21 DIAGNOSIS — I10 Essential (primary) hypertension: Secondary | ICD-10-CM | POA: Diagnosis not present

## 2023-05-21 NOTE — Progress Notes (Addendum)
Established Patient Office Visit   Subjective:  Patient ID: Shelia Roth, female    DOB: 09-21-1945  Age: 77 y.o. MRN: [id number]  Chief Complaint  Patient presents with   Medical Management of Chronic Issues    6 month follow up:     HPI Encounter Diagnoses  Name Primary?   Need for immunization against influenza Yes   Pancreatic mass    Essential hypertension    Immunization due    Iron deficiency    For follow-up of above.  Blood pressure is well-controlled controlled on current regimen.  Status post ablation for atrial fibs.  She feels better after this treatment.  There is less dyspnea on exertion and has more energy.  Routine CT of heart did show pancreatic cystic lesions.  Radiology recommended further workup and GI referral.  Continues with all medications as above.   Review of Systems  Constitutional: Negative.   HENT: Negative.    Eyes:  Negative for blurred vision, discharge and redness.  Respiratory: Negative.    Cardiovascular: Negative.   Gastrointestinal:  Negative for abdominal pain.  Genitourinary: Negative.   Musculoskeletal: Negative.  Negative for myalgias.  Skin:  Negative for rash.  Neurological:  Negative for tingling, loss of consciousness and weakness.  Endo/Heme/Allergies:  Negative for polydipsia.     Current Outpatient Medications:    alendronate (FOSAMAX) 70 MG tablet, TAKE ONE TABLET BY MOUTH ONCE WEEKLY BEFORE BREAKFAST ON THURSDAYS, Disp: 12 tablet, Rfl: 1   ALPRAZolam (XANAX) 0.25 MG tablet, Please take tablet 45 minutes prior to procedure, Disp: 1 tablet, Rfl: 0   atorvastatin (LIPITOR) 10 MG tablet, TAKE ONE TABLET BY MOUTH BEFORE BREAKFAST, Disp: 90 tablet, Rfl: 3   Calcium Carb-Cholecalciferol (CALCIUM + VITAMIN D3 PO), Take 1 tablet by mouth 2 (two) times daily. Calcium 600mg  and Vitamin d3 , Disp: , Rfl:    carvedilol (COREG) 12.5 MG tablet, TAKE 1 TABLET BY MOUTH AT BREAKFAST AND AT BEDTIME *REFILL REQUEST*, Disp: 180 tablet,  Rfl: 3   diclofenac Sodium (VOLTAREN) 1 % GEL, Apply 2 g topically 4 (four) times daily as needed., Disp: , Rfl:    ELIQUIS 5 MG TABS tablet, TAKE ONE TABLET BY MOUTH AT BREAKFAST AND AT BEDTIME, Disp: 180 tablet, Rfl: 1   ferrous sulfate 324 MG TBEC, Take 324 mg by mouth., Disp: , Rfl:    losartan (COZAAR) 50 MG tablet, TAKE ONE TABLET BY MOUTH ONCE DAILY, Disp: 90 tablet, Rfl: 3   mometasone (NASONEX) 50 MCG/ACT nasal spray, USE TWO SPRAYS IN EACH NOSTRIL DAILY, Disp: 17 g, Rfl: 12   Multiple Vitamins-Minerals (EMERGEN-C IMMUNE PLUS) PACK, Take 1 Package by mouth daily., Disp: , Rfl:    pantoprazole (PROTONIX) 40 MG tablet, Take by mouth., Disp: , Rfl:    flecainide (TAMBOCOR) 50 MG tablet, Take 50 mg by mouth 2 (two) times daily., Disp: , Rfl:    Objective:     BP (!) 144/78   Pulse 65   Temp 97.6 F (36.4 C)   Ht 5\' 4"  (1.626 m)   Wt 164 lb 12.8 oz (74.8 kg)   LMP  (LMP Unknown)   SpO2 96%   BMI 28.29 kg/m    Physical Exam Constitutional:      General: She is not in acute distress.    Appearance: Normal appearance. She is not ill-appearing, toxic-appearing or diaphoretic.  HENT:     Head: Normocephalic and atraumatic.     Right Ear: External ear normal.  Left Ear: External ear normal.     Mouth/Throat:     Mouth: Mucous membranes are moist.     Pharynx: Oropharynx is clear. No oropharyngeal exudate or posterior oropharyngeal erythema.  Eyes:     General: No scleral icterus.       Right eye: No discharge.        Left eye: No discharge.     Extraocular Movements: Extraocular movements intact.     Conjunctiva/sclera: Conjunctivae normal.     Pupils: Pupils are equal, round, and reactive to light.  Cardiovascular:     Rate and Rhythm: Normal rate and regular rhythm.  Pulmonary:     Effort: Pulmonary effort is normal. No respiratory distress.     Breath sounds: Normal breath sounds. No wheezing or rhonchi.  Abdominal:     General: Bowel sounds are normal.   Musculoskeletal:     Cervical back: No rigidity or tenderness.  Skin:    General: Skin is warm and dry.  Neurological:     Mental Status: She is alert and oriented to person, place, and time.  Psychiatric:        Mood and Affect: Mood normal.        Behavior: Behavior normal.      Results for orders placed or performed in visit on 05/21/23  Iron, TIBC and Ferritin Panel  Result Value Ref Range   Iron 91 45 - 160 mcg/dL   TIBC 409 811 - 914 mcg/dL (calc)   %SAT 33 16 - 45 % (calc)   Ferritin 96 16 - 288 ng/mL      The 10-year ASCVD risk score (Arnett DK, et al., 2019) is: 28.1%    Assessment & Plan:   Need for immunization against influenza -     Flu Vaccine Trivalent High Dose (Fluad)  Pancreatic mass -     MR ABDOMEN W WO CONTRAST; Future -     Ambulatory referral to Gastroenterology -     ALPRAZolam; Please take tablet 45 minutes prior to procedure  Dispense: 1 tablet; Refill: 0  Essential hypertension  Immunization due -     Tdap vaccine greater than or equal to 7yo IM  Iron deficiency -     Iron, TIBC and Ferritin Panel    Return in about 6 months (around 11/18/2023).    Mliss Sax, MD

## 2023-05-22 LAB — IRON,TIBC AND FERRITIN PANEL
%SAT: 33 % (ref 16–45)
Ferritin: 96 ng/mL (ref 16–288)
Iron: 91 ug/dL (ref 45–160)
TIBC: 273 ug/dL (ref 250–450)

## 2023-05-31 MED ORDER — ALPRAZOLAM 0.25 MG PO TABS
ORAL_TABLET | ORAL | 0 refills | Status: DC
Start: 2023-05-31 — End: 2023-06-26

## 2023-05-31 NOTE — Addendum Note (Signed)
Addended by: Andrez Grime on: 05/31/2023 09:03 AM   Modules accepted: Orders

## 2023-06-07 ENCOUNTER — Telehealth: Payer: Self-pay | Admitting: Gastroenterology

## 2023-06-07 NOTE — Telephone Encounter (Signed)
Called scheduling.  They have added a request to the MRI scheduled for 10-30 to have expedited radiology interpretation Appointment held for patient to be seen by Dr. Barron Alvine on Tuesday, 11-5 at 10:20 am. Patient informed via phone.

## 2023-06-07 NOTE — Telephone Encounter (Signed)
Good morning Dr. Barron Alvine,     We received a referral for patient for a pancreatic mass. Patient stated she will be having a MRI on 10/30. Please review and advise on scheduling.     Thank you.

## 2023-06-18 ENCOUNTER — Ambulatory Visit (INDEPENDENT_AMBULATORY_CARE_PROVIDER_SITE_OTHER): Payer: Medicare Other

## 2023-06-18 VITALS — BP 128/70 | HR 70 | Temp 98.4°F | Ht 64.0 in | Wt 165.8 lb

## 2023-06-18 DIAGNOSIS — Z0000 Encounter for general adult medical examination without abnormal findings: Secondary | ICD-10-CM | POA: Diagnosis not present

## 2023-06-18 NOTE — Progress Notes (Signed)
Subjective:   Shelia Roth is a 77 y.o. female who presents for Medicare Annual (Subsequent) preventive examination.  Visit Complete: In person  Patient Medicare AWV questionnaire was completed by the patient on 06/14/2023; I have confirmed that all information answered by patient is correct and no changes since this date.  Cardiac Risk Factors include: advanced age (>64men, >61 women);hypertension     Objective:    Today's Vitals   06/18/23 0859  BP: 128/70  Pulse: 70  Temp: 98.4 F (36.9 C)  TempSrc: Oral  SpO2: 96%  Weight: 165 lb 12.8 oz (75.2 kg)  Height: 5\' 4"  (1.626 m)   Body mass index is 28.46 kg/m.     06/18/2023    9:06 AM 05/30/2022    8:57 AM 05/24/2021    9:23 AM 05/20/2020    9:01 AM 05/14/2019   11:01 AM  Advanced Directives  Does Patient Have a Medical Advance Directive? Yes Yes No No No  Type of Advance Directive Living will Healthcare Power of [place];Living will     Copy of Healthcare Power of Attorney in Chart?  No - copy requested     Would patient like information on creating a medical advance directive?   No - Patient declined Yes (MAU/Ambulatory/Procedural Areas - Information given) No - Patient declined    Current Medications (verified) Outpatient Encounter Medications as of 06/18/2023  Medication Sig   alendronate (FOSAMAX) 70 MG tablet TAKE ONE TABLET BY MOUTH ONCE WEEKLY BEFORE BREAKFAST ON THURSDAYS   ALPRAZolam (XANAX) 0.25 MG tablet Please take tablet 45 minutes prior to procedure   atorvastatin (LIPITOR) 10 MG tablet TAKE ONE TABLET BY MOUTH BEFORE BREAKFAST   Calcium Carb-Cholecalciferol (CALCIUM + VITAMIN D3 PO) Take 1 tablet by mouth 2 (two) times daily. Calcium 600mg  and Vitamin d3   carvedilol (COREG) 12.5 MG tablet TAKE 1 TABLET BY MOUTH AT BREAKFAST AND AT BEDTIME *REFILL REQUEST*   diclofenac Sodium (VOLTAREN) 1 % GEL Apply 2 g topically 4 (four) times daily as needed.   ELIQUIS 5 MG TABS tablet TAKE ONE TABLET BY  MOUTH AT BREAKFAST AND AT BEDTIME   ferrous sulfate 324 MG TBEC Take 324 mg by mouth.   losartan (COZAAR) 50 MG tablet TAKE ONE TABLET BY MOUTH ONCE DAILY   mometasone (NASONEX) 50 MCG/ACT nasal spray USE TWO SPRAYS IN EACH NOSTRIL DAILY   pantoprazole (PROTONIX) 40 MG tablet Take by mouth.   flecainide (TAMBOCOR) 50 MG tablet Take 50 mg by mouth 2 (two) times daily.   Multiple Vitamins-Minerals (EMERGEN-C IMMUNE PLUS) PACK Take 1 Package by mouth daily. (Patient not taking: Reported on 06/18/2023)   No facility-administered encounter medications on file as of 06/18/2023.    Allergies (verified) Codeine   History: Past Medical History:  Diagnosis Date   Asthma    Atrial fibrillation (HCC)    Cataract    Chicken pox    Gallstones    Glaucoma    Hyperlipidemia    pt denies, never taken meds   Hypertension    Osteoporosis    Thyroid disease    UTI (urinary tract infection)    Past Surgical History:  Procedure Laterality Date   ABDOMINAL HYSTERECTOMY     APPENDECTOMY  1984   BREAST SURGERY     cardiac ablasion  05/05/2023   CHOLECYSTECTOMY  1980   COLONOSCOPY  2011   [facility]   ESOPHAGOGASTRODUODENOSCOPY     right after gallbladder removal    MASTECTOMY Bilateral  for mastitis. Says to do blood pressure on left    PARTIAL HIP ARTHROPLASTY  2017   replacement of head per patient    THYROIDECTOMY  1991   Family History  Problem Relation Age of Onset   COPD Father    Heart disease Father    Hypertension Father    Stomach cancer Father        said that they autospy didn't state but dr thinks he did have this. Died with internal bleeding and cardiac arrest    Diabetes Brother    Hyperlipidemia Brother    Heart disease Daughter    Stroke Maternal Grandmother    Heart attack Maternal Grandfather    Heart attack Paternal Grandmother    Early death Paternal Grandfather    Colon cancer Neg Hx    Esophageal cancer Neg Hx    Rectal cancer Neg Hx    Social  History   Socioeconomic History   Marital status: Divorced    Spouse name: Not on file   Number of children: 1   Years of education: Not on file   Highest education level: 12th grade  Occupational History   Occupation: retired  Tobacco Use   Smoking status: Never   Smokeless tobacco: Never  Vaping Use   Vaping status: Never Used  Substance and Sexual Activity   Alcohol use: No   Drug use: Never   Sexual activity: Never  Other Topics Concern   Not on file  Social History Narrative   Not on file   Social Determinants of Health   Financial Resource Strain: Low Risk  (06/14/2023)   Overall Financial Resource Strain (CARDIA)    Difficulty of Paying Living Expenses: Not hard at all  Food Insecurity: No Food Insecurity (06/14/2023)   Hunger Vital Sign    Worried About Running Out of Food in the Last Year: Never true    Ran Out of Food in the Last Year: Never true  Transportation Needs: No Transportation Needs (06/14/2023)   PRAPARE - Administrator, Civil Service (Medical): No    Lack of Transportation (Non-Medical): No  Physical Activity: Sufficiently Active (06/14/2023)   Exercise Vital Sign    Days of Exercise per Week: 6 days    Minutes of Exercise per Session: 40 min  Stress: No Stress Concern Present (06/14/2023)   [employer] of Occupational Health - Occupational Stress Questionnaire    Feeling of Stress : Only a little  Social Connections: Moderately Isolated (06/14/2023)   Social Connection and Isolation Panel [NHANES]    Frequency of Communication with Friends and Family: More than three times a week    Frequency of Social Gatherings with Friends and Family: Once a week    Attends Religious Services: More than 4 times per year    Active Member of [employer] or Organizations: No    Attends Engineer, structural: Never    Marital Status: Divorced    Tobacco Counseling Counseling given: Not Answered   Clinical Intake:  Pre-visit  preparation completed: Yes  Pain : No/denies pain     Nutritional Status: BMI 25 -29 Overweight Nutritional Risks: None Diabetes: No  How often do you need to have someone help you when you read instructions, pamphlets, or other written materials from your doctor or pharmacy?: 1 - Never  Interpreter Needed?: No  Information entered by :: NAllen LPN   Activities of Daily Living    06/14/2023   11:00 AM  In your  present state of health, do you have any difficulty performing the following activities:  Hearing? 0  Vision? 0  Difficulty concentrating or making decisions? 0  Walking or climbing stairs? 0  Dressing or bathing? 0  Doing errands, shopping? 0  Preparing Food and eating ? N  Using the Toilet? N  In the past six months, have you accidently leaked urine? Y  Comment with sneeze or cough  Do you have problems with loss of bowel control? N  Managing your Medications? N  Managing your Finances? N  Housekeeping or managing your Housekeeping? N    Patient Care Team: Mliss Sax, MD as PCP - General (Family Medicine) Gaspar Cola, [facility] (Inactive) as Pharmacist (Pharmacist)  Indicate any recent Medical Services you may have received from other than [facility] providers in the past year (date may be approximate).     Assessment:   This is a routine wellness examination for [place].  Hearing/Vision screen Hearing Screening - Comments:: Denies hearing issues Vision Screening - Comments:: Regular eye exams, [facility]   Goals Addressed             This Visit's Progress    Patient Stated       06/18/2023, stay healthy and lose weight       Depression Screen    06/18/2023    9:07 AM 11/16/2022   10:48 AM 05/30/2022    8:56 AM 05/18/2022   10:25 AM 11/01/2021   11:11 AM 09/19/2021   10:20 AM 07/19/2021    3:27 PM  PHQ 2/9 Scores  PHQ - 2 Score 0 0 0 0 0 0 0  PHQ- 9 Score 0          Fall Risk    06/14/2023   11:00 AM 11/16/2022   10:48  AM 05/30/2022    8:54 AM 05/18/2022   10:25 AM 11/01/2021   11:11 AM  Fall Risk   Falls in the past year? 0 0 0 0 0  Number falls in past yr: 0 0 0 0 0  Injury with Fall? 0 0 0    Risk for fall due to : Medication side effect No Fall Risks No Fall Risks    Follow up Falls prevention discussed;Falls evaluation completed Falls evaluation completed Falls prevention discussed      MEDICARE RISK AT HOME: Medicare Risk at Home Any stairs in or around the home?: No Home free of loose throw rugs in walkways, pet beds, electrical cords, etc?: Yes Adequate lighting in your home to reduce risk of falls?: Yes Life alert?: No Use of a cane, walker or w/c?: No Grab bars in the bathroom?: Yes Shower chair or bench in shower?: No Elevated toilet seat or a handicapped toilet?: No  TIMED UP AND GO:  Was the test performed?  Yes  Length of time to ambulate 10 feet: 5 sec Gait steady and fast without use of assistive device    Cognitive Function:        06/18/2023    9:08 AM 05/30/2022    8:57 AM  6CIT Screen  What Year? 0 points 0 points  What month? 0 points 0 points  What time? 0 points 0 points  Count back from 20 0 points 0 points  Months in reverse 0 points 0 points  Repeat phrase 2 points 0 points  Total Score 2 points 0 points    Immunizations Immunization History  Administered Date(s) Administered   19-influenza Whole 06/30/2021  Fluad Trivalent(High Dose 65+) 05/21/2023   Influenza, High Dose Seasonal PF 08/25/2018   Influenza, Quadrivalent, Recombinant, Inj, Pf 06/01/2019   Influenza-Unspecified 06/02/2013, 05/21/2017, 08/21/2018, 06/01/2019   PFIZER(Purple Top)SARS-COV-2 Vaccination 10/17/2019, 11/12/2019, 05/30/2020, 06/30/2021   Pneumococcal Conjugate-13 12/13/2014   Pneumococcal Polysaccharide-23 10/29/2012   Tdap 05/21/2023   Zoster Recombinant(Shingrix) 06/01/2019, 06/01/2019, 06/09/2020    TDAP status: Up to date  Flu Vaccine status: Up to  date  Pneumococcal vaccine status: Up to date  Covid-19 vaccine status: Information provided on how to obtain vaccines.   Qualifies for Shingles Vaccine? Yes   Zostavax completed Yes   Shingrix Completed?: Yes  Screening Tests Health Maintenance  Topic Date Due   Colonoscopy  10/06/2023   Medicare Annual Wellness (AWV)  06/17/2024   DTaP/Tdap/Td (2 - Td or Tdap) 05/20/2033   Pneumonia Vaccine 55+ Years old  Completed   DEXA SCAN  Completed   Zoster Vaccines- Shingrix  Completed   HPV VACCINES  Aged Out   INFLUENZA VACCINE  Discontinued   COVID-19 Vaccine  Discontinued   Hepatitis C Screening  Discontinued    Health Maintenance  Health Maintenance Due  Topic Date Due   Colonoscopy  10/06/2023    Colorectal cancer screening: No longer required.   Mammogram status: No longer required due to age.  Bone Density status: Completed 06/30/2021.   Lung Cancer Screening: (Low Dose CT Chest recommended if Age 74-80 years, 20 pack-year currently smoking OR have quit w/in 15years.) does not qualify.   Lung Cancer Screening Referral: no  Additional Screening:  Hepatitis C Screening: does not qualify;   Vision Screening: Recommended annual ophthalmology exams for early detection of glaucoma and other disorders of the eye. Is the patient up to date with their annual eye exam?  Yes  Who is the provider or what is the name of the office in which the patient attends annual eye exams? [facility] If pt is not established with a provider, would they like to be referred to a provider to establish care? No .   Dental Screening: Recommended annual dental exams for proper oral hygiene  Diabetic Foot Exam: n/a  Community Resource Referral / Chronic Care Management: CRR required this visit?  No   CCM required this visit?  No     Plan:     I have personally reviewed and noted the following in the patient's chart:   Medical and social history Use of alcohol, tobacco or  illicit drugs  Current medications and supplements including opioid prescriptions. Patient is not currently taking opioid prescriptions. Functional ability and status Nutritional status Physical activity Advanced directives List of other physicians Hospitalizations, surgeries, and ER visits in previous 12 months Vitals Screenings to include cognitive, depression, and falls Referrals and appointments  In addition, I have reviewed and discussed with patient certain preventive protocols, quality metrics, and best practice recommendations. A written personalized care plan for preventive services as well as general preventive health recommendations were provided to patient.     Barb Merino, LPN   [id number]   After Visit Summary: (In Person-Printed) AVS printed and given to the patient  Nurse Notes: none

## 2023-06-18 NOTE — Patient Instructions (Signed)
Ms. Tubbs , Thank you for taking time to come for your Medicare Wellness Visit. I appreciate your ongoing commitment to your health goals. Please review the following plan we discussed and let me know if I can assist you in the future.   Referrals/Orders/Follow-Ups/Clinician Recommendations: none  This is a list of the screening recommended for you and due dates:  Health Maintenance  Topic Date Due   Colon Cancer Screening  10/06/2023   Medicare Annual Wellness Visit  06/17/2024   DTaP/Tdap/Td vaccine (2 - Td or Tdap) 05/20/2033   Pneumonia Vaccine  Completed   DEXA scan (bone density measurement)  Completed   Zoster (Shingles) Vaccine  Completed   HPV Vaccine  Aged Out   Flu Shot  Discontinued   COVID-19 Vaccine  Discontinued   Hepatitis C Screening  Discontinued    Advanced directives: (Copy Requested) Please bring a copy of your health care power of attorney and living will to the office to be added to your chart at your convenience.  Next Medicare Annual Wellness Visit scheduled for next year: Yes  Insert Preventive Care attachment Insert FALL PREVENTION attachment if needed

## 2023-06-20 ENCOUNTER — Ambulatory Visit (HOSPITAL_BASED_OUTPATIENT_CLINIC_OR_DEPARTMENT_OTHER)
Admission: RE | Admit: 2023-06-20 | Discharge: 2023-06-20 | Disposition: A | Discharge status: Home / Self Care | Payer: Medicare Other | Source: Ambulatory Visit | Attending: Family Medicine | Admitting: Family Medicine

## 2023-06-20 DIAGNOSIS — K8689 Other specified diseases of pancreas: Secondary | ICD-10-CM | POA: Insufficient documentation

## 2023-06-20 DIAGNOSIS — Z9049 Acquired absence of other specified parts of digestive tract: Secondary | ICD-10-CM | POA: Diagnosis not present

## 2023-06-20 DIAGNOSIS — N281 Cyst of kidney, acquired: Secondary | ICD-10-CM | POA: Diagnosis not present

## 2023-06-20 MED ORDER — GADOBUTROL 1 MMOL/ML IV SOLN
7.5000 mL | Freq: Once | INTRAVENOUS | Status: AC | PRN
  Administered 2023-06-20: 7.5 mL via INTRAVENOUS

## 2023-06-26 ENCOUNTER — Encounter: Payer: Self-pay | Admitting: Gastroenterology

## 2023-06-26 ENCOUNTER — Ambulatory Visit: Payer: Medicare Other | Admitting: Gastroenterology

## 2023-06-26 VITALS — BP 144/84 | HR 60 | Ht 64.0 in | Wt 165.8 lb

## 2023-06-26 DIAGNOSIS — K862 Cyst of pancreas: Secondary | ICD-10-CM

## 2023-06-26 DIAGNOSIS — I480 Paroxysmal atrial fibrillation: Secondary | ICD-10-CM | POA: Diagnosis not present

## 2023-06-26 DIAGNOSIS — Z860100 Personal history of colon polyps, unspecified: Secondary | ICD-10-CM

## 2023-06-26 DIAGNOSIS — R933 Abnormal findings on diagnostic imaging of other parts of digestive tract: Secondary | ICD-10-CM | POA: Diagnosis not present

## 2023-06-26 DIAGNOSIS — Z7901 Long term (current) use of anticoagulants: Secondary | ICD-10-CM | POA: Diagnosis not present

## 2023-06-26 NOTE — Patient Instructions (Addendum)
_______________________________________________________  If your blood pressure at your visit was 140/90 or greater, please contact your primary care physician to follow up on this. _______________________________________________________  If you are age 76 or older, your body mass index should be between 23-30. Your Body mass index is 28.46 kg/m. If this is out of the aforementioned range listed, please consider follow up with your Primary Care Provider. ________________________________________________________  We will refer to [facility] with Dr Lanell Matar.  Please let us know if you have not heard from them in 1 to 2 weeks.   The [facility] providers would like to encourage you to use [facility] to communicate with providers for non-urgent requests or questions.  Due to long hold times on the telephone, sending your provider a message by [facility] may be a faster and more efficient way to get a response.  Please allow 48 business hours for a response.  Please remember that this is for non-urgent requests.  _______________________________________________________  It was a pleasure to see you today!  Vito Cirigliano, D.O.

## 2023-06-26 NOTE — Progress Notes (Unsigned)
Chief Complaint:    Abnormal MRI  HPI:     Patient is a 77 y.o. female with a history of A-fib  s/p recent ablation (on Eliquis), HTN, osteoporosis, vitamin D deficiency, presenting to the Gastroenterology Clinic to discuss results from recent MRI.  Was admitted to [facility] in 04/2023 for ablation for symptomatic A-fib with RVR.  During her evaluation, cardiac CT incidentally showed pancreatic cystic lesions.  She was seen by her PCM on 05/21/2023 and ordered MRI abdomen and GI referral.  She is otherwise without abdominal pain, nausea/vomiting, weight loss, change in bowel habits.  - 06/20/2023: MRI abdomen: Diffusely atrophic pancreas with numerous cystic lesions throughout.  Large complex multiseptated cluster of cystic lesions within the central pancreatic head measuring 3.7 x 2.6 cm.  An additional cluster within the pancreatic body and tail measuring 3.5 x 2.1 cm.  No obvious solid component or suspicious contrast-enhancement.  No PD dilation.  Per Radiologist, most consistent with sidebranch IPMN's and/or pseudocystic change and recommend EUS with FNA.  Separately, multiple small, benign-appearing liver cysts without duct dilation.  Otherwise normal visualized GI tract and no lymphadenopathy.  1 cousin with pancreatic cancer, but otherwise no Fhx of liver, pancreatic, or biliary disease, no CRC, IBD.   Endoscopic History: - 12/08/2020: EGD: Normal esophagus, Hill grade 3 valve, 3 mm benign gastric polyp, mild gastritis with single healing antral ulcer, normal duodenum - 10/05/2020: Colonoscopy: 12 mm cecal SSP, 2 distal benign HP's.  Tortuous colon with significant looping.  Repeat in 3 years with abdominal binder and ultraslim scope -Colonoscopy (01/2010, Dr. Bascom Levels at [facility]): Official report unavailable, but limited note suggests no abnormality noted with recommendation repeat in 5 years. -EGD many years ago in [place] n/f gastritis without ulcers per patient   Review of systems:      No chest pain, no SOB, no fevers, no urinary sx   Past Medical History:  Diagnosis Date   Asthma    Atrial fibrillation (HCC)    Cataract    Chicken pox    Gallstones    Glaucoma    Hyperlipidemia    pt denies, never taken meds   Hypertension    Osteoporosis    Thyroid disease    UTI (urinary tract infection)     Patient's surgical history, family medical history, social history, medications and allergies were all reviewed in Epic    Current Outpatient Medications  Medication Sig Dispense Refill   alendronate (FOSAMAX) 70 MG tablet TAKE ONE TABLET BY MOUTH ONCE WEEKLY BEFORE BREAKFAST ON THURSDAYS 12 tablet 1   atorvastatin (LIPITOR) 10 MG tablet TAKE ONE TABLET BY MOUTH BEFORE BREAKFAST 90 tablet 3   Calcium Carb-Cholecalciferol (CALCIUM + VITAMIN D3 PO) Take 1 tablet by mouth 2 (two) times daily. Calcium 600mg  and Vitamin d3     carvedilol (COREG) 12.5 MG tablet TAKE 1 TABLET BY MOUTH AT BREAKFAST AND AT BEDTIME *REFILL REQUEST* 180 tablet 3   diclofenac Sodium (VOLTAREN) 1 % GEL Apply 2 g topically 4 (four) times daily as needed.     ELIQUIS 5 MG TABS tablet TAKE ONE TABLET BY MOUTH AT BREAKFAST AND AT BEDTIME 180 tablet 1   ferrous sulfate 324 MG TBEC Take 324 mg by mouth.     losartan (COZAAR) 50 MG tablet TAKE ONE TABLET BY MOUTH ONCE DAILY 90 tablet 3   mometasone (NASONEX) 50 MCG/ACT nasal spray USE TWO SPRAYS IN EACH NOSTRIL DAILY 17 g 12   Multiple Vitamin (  MULTIVITAMIN WITH MINERALS) TABS tablet Take 1 tablet by mouth daily.     pantoprazole (PROTONIX) 40 MG tablet Take by mouth.     flecainide (TAMBOCOR) 50 MG tablet Take 50 mg by mouth 2 (two) times daily.     No current facility-administered medications for this visit.    Physical Exam:     BP (!) 142/80   Pulse 60   Ht 5\' 4"  (1.626 m)   Wt 165 lb 12.8 oz (75.2 kg)   LMP  (LMP Unknown)   BMI 28.46 kg/m   GENERAL:  Pleasant female in NAD PSYCH: : Cooperative, normal affect Musculoskeletal:   Normal muscle tone, normal strength NEURO: Alert and oriented x 3, no focal neurologic deficits   IMPRESSION and PLAN:    1) Pancreatic cysts 2) Atrophic appearing pancreas 3) Abnormal imaging study Cardiac CT with incidentally noted pancreatic cystic lesions.  Subsequent MRI abdomen with diffusely atrophic appearing pancreas with multiple cysts throughout the pancreas, including a large complex cyst in the pancreatic head and another cluster in the pancreatic body/tail, each measuring >3 cm.  She is otherwise asymptomatic.  - Referral to Dr. Lanell Matar at [facility] for evaluation and EUS. Will need to coordinate Eliquis hold with Cardiology after her upcoming f/u. Will hold off on tumor markers until EUS completed  4) Atrial fibrillation 5) Systemic anticoagulation Recent ablation for symptomatic A-fib and currently on Eliquis. Has f/u with her Cardiologist on 12/3. - Will need to coordinate Eliquis hold and appropriate timing of EUS after she has a chance to meet with the advanced GI service at [facility]  6) History of colon polyps - Due for repeat colonoscopy in 09/2023 for ongoing polyp surveillance.  This may need to be delayed depending on timing of EUS, ability to hold Eliquis, etc. Similarly, this may get coordinated to be done at [facility], which would be perfectly reasonable          Shellia Cleverly ,DO, FACG 06/26/2023, 10:56 AM

## 2023-06-27 ENCOUNTER — Other Ambulatory Visit: Payer: Self-pay | Admitting: Family Medicine

## 2023-06-27 DIAGNOSIS — M8000XS Age-related osteoporosis with current pathological fracture, unspecified site, sequela: Secondary | ICD-10-CM

## 2023-07-23 ENCOUNTER — Other Ambulatory Visit: Payer: Self-pay | Admitting: Family Medicine

## 2023-07-23 DIAGNOSIS — I480 Paroxysmal atrial fibrillation: Secondary | ICD-10-CM

## 2023-07-24 DIAGNOSIS — Z8679 Personal history of other diseases of the circulatory system: Secondary | ICD-10-CM | POA: Diagnosis not present

## 2023-07-24 DIAGNOSIS — Z9889 Other specified postprocedural states: Secondary | ICD-10-CM | POA: Diagnosis not present

## 2023-07-24 DIAGNOSIS — I10 Essential (primary) hypertension: Secondary | ICD-10-CM | POA: Diagnosis not present

## 2023-07-24 DIAGNOSIS — I4819 Other persistent atrial fibrillation: Secondary | ICD-10-CM | POA: Diagnosis not present

## 2023-07-24 DIAGNOSIS — I447 Left bundle-branch block, unspecified: Secondary | ICD-10-CM | POA: Diagnosis not present

## 2023-07-24 DIAGNOSIS — I480 Paroxysmal atrial fibrillation: Secondary | ICD-10-CM | POA: Diagnosis not present

## 2023-07-25 DIAGNOSIS — I447 Left bundle-branch block, unspecified: Secondary | ICD-10-CM | POA: Diagnosis not present

## 2023-07-25 DIAGNOSIS — I440 Atrioventricular block, first degree: Secondary | ICD-10-CM | POA: Diagnosis not present

## 2023-07-26 DIAGNOSIS — Z8719 Personal history of other diseases of the digestive system: Secondary | ICD-10-CM | POA: Diagnosis not present

## 2023-07-26 DIAGNOSIS — I480 Paroxysmal atrial fibrillation: Secondary | ICD-10-CM | POA: Diagnosis not present

## 2023-07-26 DIAGNOSIS — I10 Essential (primary) hypertension: Secondary | ICD-10-CM | POA: Diagnosis not present

## 2023-07-26 DIAGNOSIS — R9389 Abnormal findings on diagnostic imaging of other specified body structures: Secondary | ICD-10-CM | POA: Diagnosis not present

## 2023-07-26 DIAGNOSIS — I447 Left bundle-branch block, unspecified: Secondary | ICD-10-CM | POA: Diagnosis not present

## 2023-07-26 DIAGNOSIS — Z79899 Other long term (current) drug therapy: Secondary | ICD-10-CM | POA: Diagnosis not present

## 2023-07-26 DIAGNOSIS — K862 Cyst of pancreas: Secondary | ICD-10-CM | POA: Diagnosis not present

## 2023-08-21 ENCOUNTER — Other Ambulatory Visit: Payer: Self-pay | Admitting: Family Medicine

## 2023-08-21 DIAGNOSIS — M8000XS Age-related osteoporosis with current pathological fracture, unspecified site, sequela: Secondary | ICD-10-CM

## 2023-08-31 ENCOUNTER — Telehealth: Payer: Self-pay | Admitting: Family Medicine

## 2023-08-31 NOTE — Telephone Encounter (Signed)
 error

## 2023-10-22 ENCOUNTER — Other Ambulatory Visit: Payer: Self-pay | Admitting: Family Medicine

## 2023-10-22 DIAGNOSIS — E7800 Pure hypercholesterolemia, unspecified: Secondary | ICD-10-CM

## 2023-10-23 MED ORDER — ATORVASTATIN CALCIUM 10 MG PO TABS: 10.0000 mg | ORAL_TABLET | Freq: Every day | ORAL | 3 refills | Status: AC

## 2023-10-23 NOTE — Addendum Note (Signed)
 Addended by: Nadene Rubins A on: 10/23/2023 10:10 AM   Modules accepted: Orders

## 2023-11-19 ENCOUNTER — Ambulatory Visit: Payer: Medicare Other | Admitting: Family Medicine

## 2023-11-20 ENCOUNTER — Other Ambulatory Visit: Payer: Self-pay | Admitting: Family Medicine

## 2023-11-20 DIAGNOSIS — I10 Essential (primary) hypertension: Secondary | ICD-10-CM

## 2023-11-26 ENCOUNTER — Encounter: Payer: Self-pay | Admitting: Family Medicine

## 2023-11-26 ENCOUNTER — Ambulatory Visit (INDEPENDENT_AMBULATORY_CARE_PROVIDER_SITE_OTHER): Payer: Medicare Other | Admitting: Family Medicine

## 2023-11-26 VITALS — BP 114/64 | HR 67 | Temp 97.6°F | Ht 64.0 in | Wt 156.2 lb

## 2023-11-26 DIAGNOSIS — I10 Essential (primary) hypertension: Secondary | ICD-10-CM | POA: Diagnosis not present

## 2023-11-26 DIAGNOSIS — D649 Anemia, unspecified: Secondary | ICD-10-CM | POA: Diagnosis not present

## 2023-11-26 DIAGNOSIS — E611 Iron deficiency: Secondary | ICD-10-CM | POA: Diagnosis not present

## 2023-11-26 DIAGNOSIS — E7800 Pure hypercholesterolemia, unspecified: Secondary | ICD-10-CM

## 2023-11-26 LAB — URINALYSIS, ROUTINE W REFLEX MICROSCOPIC
Bilirubin Urine: NEGATIVE
Ketones, ur: NEGATIVE
Leukocytes,Ua: NEGATIVE
Nitrite: NEGATIVE
Specific Gravity, Urine: 1.025 (ref 1.000–1.030)
Total Protein, Urine: NEGATIVE
Urine Glucose: NEGATIVE
Urobilinogen, UA: 0.2 (ref 0.0–1.0)
pH: 6 (ref 5.0–8.0)

## 2023-11-26 LAB — CBC WITH DIFFERENTIAL/PLATELET
Basophils Absolute: 0 10*3/uL (ref 0.0–0.1)
Basophils Relative: 1 % (ref 0.0–3.0)
Eosinophils Absolute: 0.2 10*3/uL (ref 0.0–0.7)
Eosinophils Relative: 5.2 % — ABNORMAL HIGH (ref 0.0–5.0)
HCT: 37.9 % (ref 36.0–46.0)
Hemoglobin: 12.7 g/dL (ref 12.0–15.0)
Lymphocytes Relative: 28.1 % (ref 12.0–46.0)
Lymphs Abs: 1.1 10*3/uL (ref 0.7–4.0)
MCHC: 33.5 g/dL (ref 30.0–36.0)
MCV: 94.8 fl (ref 78.0–100.0)
Monocytes Absolute: 0.3 10*3/uL (ref 0.1–1.0)
Monocytes Relative: 8.2 % (ref 3.0–12.0)
Neutro Abs: 2.3 10*3/uL (ref 1.4–7.7)
Neutrophils Relative %: 57.5 % (ref 43.0–77.0)
Platelets: 223 10*3/uL (ref 150.0–400.0)
RBC: 4 Mil/uL (ref 3.87–5.11)
RDW: 13.4 % (ref 11.5–15.5)
WBC: 4.1 10*3/uL (ref 4.0–10.5)

## 2023-11-26 LAB — COMPREHENSIVE METABOLIC PANEL WITH GFR
ALT: 10 U/L (ref 0–35)
AST: 13 U/L (ref 0–37)
Albumin: 4.1 g/dL (ref 3.5–5.2)
Alkaline Phosphatase: 69 U/L (ref 39–117)
BUN: 26 mg/dL — ABNORMAL HIGH (ref 6–23)
CO2: 30 meq/L (ref 19–32)
Calcium: 9.1 mg/dL (ref 8.4–10.5)
Chloride: 104 meq/L (ref 96–112)
Creatinine, Ser: 0.8 mg/dL (ref 0.40–1.20)
GFR: 71.43 mL/min (ref >60.00)
Glucose, Bld: 97 mg/dL (ref 70–99)
Potassium: 3.9 meq/L (ref 3.5–5.1)
Sodium: 141 meq/L (ref 135–145)
Total Bilirubin: 0.7 mg/dL (ref 0.2–1.2)
Total Protein: 6.9 g/dL (ref 6.0–8.3)

## 2023-11-26 LAB — LIPID PANEL
Cholesterol: 141 mg/dL (ref 0–200)
HDL: 51.1 mg/dL (ref >39.00)
LDL Cholesterol: 73 mg/dL (ref 0–99)
NonHDL: 89.93
Total CHOL/HDL Ratio: 3
Triglycerides: 86 mg/dL (ref 0.0–149.0)
VLDL: 17.2 mg/dL (ref 0.0–40.0)

## 2023-11-26 LAB — VITAMIN B12: Vitamin B-12: 475 pg/mL (ref 211–911)

## 2023-11-26 NOTE — Progress Notes (Signed)
 Established Patient Office Visit   Subjective:  Patient ID: Shelia Roth, female    DOB: [birth year]  Age: 78 y.o. MRN: [id number]  Chief Complaint  Patient presents with   Medical Management of Chronic Issues    6 month follow up. Pt is fasting.     HPI Encounter Diagnoses  Name Primary?   Essential hypertension Yes   Elevated LDL cholesterol level    Anemia, unspecified type    Iron deficiency    Doing well.  For follow-up of above.  His moved.  Her grandson Marcello Moores continues to live with her and she is okay with this.  Continues on medications as above.  Continues with  the iron and is tolerating it.  Recent iron studies were okay.  No chest pain palpitations or shortness of breath.  Completed course of therapy with Fosamax for 7 years.  Insurance stopped covering it.  She continues taking vitamin D and calcium.   Review of Systems  Constitutional: Negative.   HENT: Negative.    Eyes:  Negative for blurred vision, discharge and redness.  Respiratory: Negative.  Negative for shortness of breath.   Cardiovascular: Negative.  Negative for chest pain and palpitations.  Gastrointestinal:  Negative for abdominal pain and blood in stool.  Genitourinary: Negative.  Negative for hematuria.  Musculoskeletal: Negative.  Negative for myalgias.  Skin:  Negative for rash.  Neurological:  Negative for tingling, loss of consciousness and weakness.  Endo/Heme/Allergies:  Negative for polydipsia.     Current Outpatient Medications:    atorvastatin (LIPITOR) 10 MG tablet, Take 1 tablet (10 mg total) by mouth daily before breakfast., Disp: 90 tablet, Rfl: 3   Calcium Carb-Cholecalciferol (CALCIUM + VITAMIN D3 PO), Take 1 tablet by mouth 2 (two) times daily. Calcium 600mg  and Vitamin d3 , Disp: , Rfl:    carvedilol (COREG) 12.5 MG tablet, TAKE 1 TABLET BY MOUTH AT BREAKFAST AND AT BEDTIME *REFILL REQUEST*, Disp: 180 tablet, Rfl: 3   diclofenac Sodium (VOLTAREN) 1 % GEL, Apply 2 g  topically 4 (four) times daily as needed., Disp: , Rfl:    ELIQUIS 5 MG TABS tablet, TAKE 1 TABLET BY MOUTH AT BREAKFAST AND AT BEDTIME, Disp: 60 tablet, Rfl: 10   ferrous sulfate 324 MG TBEC, Take 324 mg by mouth., Disp: , Rfl:    flecainide (TAMBOCOR) 50 MG tablet, Take 50 mg by mouth 2 (two) times daily., Disp: , Rfl:    losartan (COZAAR) 50 MG tablet, TAKE 1 TABLET BY MOUTH ONCE DAILY, Disp: 30 tablet, Rfl: 10   mometasone (NASONEX) 50 MCG/ACT nasal spray, USE TWO SPRAYS IN EACH NOSTRIL DAILY, Disp: 17 g, Rfl: 12   Multiple Vitamin (MULTIVITAMIN WITH MINERALS) TABS tablet, Take 1 tablet by mouth daily., Disp: , Rfl:    alendronate (FOSAMAX) 70 MG tablet, TAKE 1 TABLET BY MOUTH ONCE WEEKLY BEFORE BREAKFAST ON THURSDAYS, Disp: 4 tablet, Rfl: 10   pantoprazole (PROTONIX) 40 MG tablet, Take by mouth. (Patient not taking: Reported on 11/26/2023), Disp: , Rfl:    Objective:     BP 114/64 (BP Location: Left Arm, Patient Position: Sitting, Cuff Size: Normal)   Pulse 67   Temp 97.6 F (36.4 C) (Temporal)   Ht 5\' 4"  (1.626 m)   Wt 156 lb 3.2 oz (70.9 kg)   LMP  (LMP Unknown)   SpO2 97%   BMI 26.81 kg/m    Physical Exam Constitutional:      General: She is not in acute distress.  Appearance: Normal appearance. She is not ill-appearing, toxic-appearing or diaphoretic.  HENT:     Head: Normocephalic and atraumatic.     Right Ear: External ear normal.     Left Ear: External ear normal.     Mouth/Throat:     Mouth: Mucous membranes are moist.     Pharynx: Oropharynx is clear. No oropharyngeal exudate or posterior oropharyngeal erythema.  Eyes:     General: No scleral icterus.       Right eye: No discharge.        Left eye: No discharge.     Extraocular Movements: Extraocular movements intact.     Conjunctiva/sclera: Conjunctivae normal.     Pupils: Pupils are equal, round, and reactive to light.  Cardiovascular:     Rate and Rhythm: Normal rate and regular rhythm.  Pulmonary:      Effort: Pulmonary effort is normal. No respiratory distress.     Breath sounds: Normal breath sounds. No wheezing or rales.  Abdominal:     General: Bowel sounds are normal.  Musculoskeletal:     Cervical back: No rigidity or tenderness.  Skin:    General: Skin is warm and dry.  Neurological:     Mental Status: She is alert and oriented to person, place, and time.  Psychiatric:        Mood and Affect: Mood normal.        Behavior: Behavior normal.      No results found for any visits on 11/26/23.    The 10-year ASCVD risk score (Arnett DK, et al., 2019) is: 18.6%    Assessment & Plan:   Essential hypertension -     CBC with Differential/Platelet -     Comprehensive metabolic panel with GFR -     Urinalysis, Routine w reflex microscopic  Elevated LDL cholesterol level -     Comprehensive metabolic panel with GFR -     Lipid panel  Anemia, unspecified type -     CBC with Differential/Platelet -     Vitamin B12  Iron deficiency    Return in about 6 months (around 05/27/2024).    Mliss Sax, MD

## 2023-12-17 DIAGNOSIS — Z8679 Personal history of other diseases of the circulatory system: Secondary | ICD-10-CM | POA: Diagnosis not present

## 2023-12-17 DIAGNOSIS — I447 Left bundle-branch block, unspecified: Secondary | ICD-10-CM | POA: Diagnosis not present

## 2023-12-17 DIAGNOSIS — I454 Nonspecific intraventricular block: Secondary | ICD-10-CM | POA: Diagnosis not present

## 2023-12-17 DIAGNOSIS — I440 Atrioventricular block, first degree: Secondary | ICD-10-CM | POA: Diagnosis not present

## 2023-12-17 DIAGNOSIS — I480 Paroxysmal atrial fibrillation: Secondary | ICD-10-CM | POA: Diagnosis not present

## 2023-12-17 DIAGNOSIS — Z9889 Other specified postprocedural states: Secondary | ICD-10-CM | POA: Diagnosis not present

## 2023-12-17 DIAGNOSIS — I10 Essential (primary) hypertension: Secondary | ICD-10-CM | POA: Diagnosis not present

## 2023-12-17 DIAGNOSIS — R001 Bradycardia, unspecified: Secondary | ICD-10-CM | POA: Diagnosis not present

## 2023-12-18 ENCOUNTER — Ambulatory Visit: Admitting: Family Medicine

## 2023-12-24 ENCOUNTER — Encounter: Payer: Self-pay | Admitting: Family Medicine

## 2023-12-24 ENCOUNTER — Ambulatory Visit (INDEPENDENT_AMBULATORY_CARE_PROVIDER_SITE_OTHER): Admitting: Family Medicine

## 2023-12-24 VITALS — BP 138/68 | HR 65 | Temp 97.5°F | Ht 64.0 in | Wt 156.2 lb

## 2023-12-24 DIAGNOSIS — Z87442 Personal history of urinary calculi: Secondary | ICD-10-CM | POA: Diagnosis not present

## 2023-12-24 DIAGNOSIS — Z7901 Long term (current) use of anticoagulants: Secondary | ICD-10-CM | POA: Diagnosis not present

## 2023-12-24 DIAGNOSIS — R319 Hematuria, unspecified: Secondary | ICD-10-CM | POA: Diagnosis not present

## 2023-12-24 LAB — POC URINALSYSI DIPSTICK (AUTOMATED)
Bilirubin, UA: NEGATIVE
Blood, UA: 10
Glucose, UA: NEGATIVE
Ketones, UA: NEGATIVE
Leukocytes, UA: NEGATIVE
Nitrite, UA: NEGATIVE
Protein, UA: NEGATIVE
Spec Grav, UA: 1.015 (ref 1.010–1.025)
Urobilinogen, UA: 0.2 U/dL
pH, UA: 6 (ref 5.0–8.0)

## 2023-12-24 LAB — URINALYSIS, ROUTINE W REFLEX MICROSCOPIC
Bilirubin Urine: NEGATIVE
Hgb urine dipstick: NEGATIVE
Ketones, ur: NEGATIVE
Leukocytes,Ua: NEGATIVE
Nitrite: NEGATIVE
RBC / HPF: NONE SEEN (ref 0–?)
Specific Gravity, Urine: 1.01 (ref 1.000–1.030)
Total Protein, Urine: NEGATIVE
Urine Glucose: NEGATIVE
Urobilinogen, UA: 0.2 (ref 0.0–1.0)
pH: 6 (ref 5.0–8.0)

## 2023-12-24 NOTE — Progress Notes (Signed)
 Established Patient Office Visit   Subjective:  Patient ID: Shelia Roth, female    DOB: 09-04-1945  Age: 78 y.o. MRN: [id number]  Chief Complaint  Patient presents with   Hematuria    Recheck blood in urine    Hematuria Irritative symptoms do not include frequency or urgency. Associated symptoms include flank pain. Pertinent negatives include no abdominal pain or dysuria.   Encounter Diagnoses  Name Primary?   Hematuria, unspecified type Yes   History of kidney stones    Anticoagulated on Eliquis     Follow-up of hematuria.  She denies gross hematuria.  Distant history of renal lithiasis.  She occasionally experiences discomfort in the right flank area.  There is no dysuria.  She is taking Eliquis  with a history of atrial fibs.   Review of Systems  Constitutional: Negative.   HENT: Negative.    Eyes:  Negative for blurred vision, discharge and redness.  Respiratory: Negative.    Cardiovascular: Negative.   Gastrointestinal:  Negative for abdominal pain and blood in stool.  Genitourinary:  Positive for flank pain and hematuria. Negative for dysuria, frequency and urgency.  Musculoskeletal:  Negative for myalgias.  Skin:  Negative for rash.  Neurological:  Negative for tingling, loss of consciousness and weakness.  Endo/Heme/Allergies:  Negative for polydipsia.     Current Outpatient Medications:    atorvastatin  (LIPITOR) 10 MG tablet, Take 1 tablet (10 mg total) by mouth daily before breakfast., Disp: 90 tablet, Rfl: 3   Calcium  Carb-Cholecalciferol (CALCIUM  + VITAMIN D3 PO), Take 1 tablet by mouth 2 (two) times daily. Calcium  600mg  and Vitamin d3 20mcg, Disp: , Rfl:    carvedilol  (COREG ) 12.5 MG tablet, TAKE 1 TABLET BY MOUTH AT BREAKFAST AND AT BEDTIME *REFILL REQUEST*, Disp: 180 tablet, Rfl: 3   diclofenac Sodium (VOLTAREN) 1 % GEL, Apply 2 g topically 4 (four) times daily as needed., Disp: , Rfl:    ELIQUIS  5 MG TABS tablet, TAKE 1 TABLET BY MOUTH AT BREAKFAST AND AT  BEDTIME, Disp: 60 tablet, Rfl: 10   ferrous sulfate 324 MG TBEC, Take 324 mg by mouth., Disp: , Rfl:    losartan  (COZAAR ) 50 MG tablet, TAKE 1 TABLET BY MOUTH ONCE DAILY, Disp: 30 tablet, Rfl: 10   mometasone  (NASONEX ) 50 MCG/ACT nasal spray, USE TWO SPRAYS IN EACH NOSTRIL DAILY, Disp: 17 g, Rfl: 12   Multiple Vitamin (MULTIVITAMIN WITH MINERALS) TABS tablet, Take 1 tablet by mouth daily., Disp: , Rfl:    flecainide (TAMBOCOR) 50 MG tablet, Take 50 mg by mouth 2 (two) times daily., Disp: , Rfl:    Objective:     BP 138/68 (BP Location: Left Arm, Patient Position: Sitting, Cuff Size: Normal)   Pulse 65   Temp (!) 97.5 F (36.4 C) (Temporal)   Ht 5\' 4"  (1.626 m)   Wt 156 lb 3.2 oz (70.9 kg)   LMP  (LMP Unknown)   SpO2 97%   BMI 26.81 kg/m    Physical Exam Constitutional:      General: She is not in acute distress.    Appearance: Normal appearance. She is not ill-appearing, toxic-appearing or diaphoretic.  HENT:     Head: Normocephalic and atraumatic.     Right Ear: External ear normal.     Left Ear: External ear normal.  Eyes:     General: No scleral icterus.       Right eye: No discharge.        Left eye: No discharge.  Extraocular Movements: Extraocular movements intact.     Conjunctiva/sclera: Conjunctivae normal.  Cardiovascular:     Rate and Rhythm: Normal rate and regular rhythm.  Pulmonary:     Effort: Pulmonary effort is normal. No respiratory distress.     Breath sounds: Normal breath sounds.  Abdominal:     General: Bowel sounds are normal.     Tenderness: There is no right CVA tenderness or left CVA tenderness.  Musculoskeletal:     Cervical back: No rigidity or tenderness.  Lymphadenopathy:     Cervical: No cervical adenopathy.  Skin:    General: Skin is warm and dry.  Neurological:     Mental Status: She is alert and oriented to person, place, and time.  Psychiatric:        Mood and Affect: Mood normal.        Behavior: Behavior normal.       Results for orders placed or performed in visit on 12/24/23  POCT Urinalysis Dipstick (Automated)  Result Value Ref Range   Color, UA yellow    Clarity, UA clear    Glucose, UA Negative Negative   Bilirubin, UA neg    Ketones, UA neg    Spec Grav, UA 1.015 1.010 - 1.025   Blood, UA 10    pH, UA 6.0 5.0 - 8.0   Protein, UA Negative Negative   Urobilinogen, UA 0.2 0.2 or 1.0 E.U./dL   Nitrite, UA neg    Leukocytes, UA Negative Negative      The 10-year ASCVD risk score (Arnett DK, et al., 2019) is: 26%    Assessment & Plan:   Hematuria, unspecified type -     POCT Urinalysis Dipstick (Automated) -     Urinalysis, Routine w reflex microscopic -     Urine Culture -     Ambulatory referral to Urology  History of kidney stones -     Ambulatory referral to Urology  Anticoagulated on Eliquis  -     Ambulatory referral to Urology    Return Follow-up as previously directed.    Tonna Frederic, MD

## 2023-12-25 LAB — URINE CULTURE
MICRO NUMBER:: 16413001
Result:: NO GROWTH
SPECIMEN QUALITY:: ADEQUATE

## 2024-01-28 NOTE — Progress Notes (Incomplete)
 Chief Complaint:   History of Present Illness:  Shelia Roth is a 78 y.o. female who is seen in consultation from Tonna Frederic, MD for evaluation of hematuria.   Past Medical History:  Past Medical History:  Diagnosis Date   Asthma    Atrial fibrillation (HCC)    Cataract    Chicken pox    Gallstones    Glaucoma    Hyperlipidemia    pt denies, never taken meds   Hypertension    Osteoporosis    Thyroid  disease    UTI (urinary tract infection)     Past Surgical History:  Past Surgical History:  Procedure Laterality Date   ABDOMINAL HYSTERECTOMY     APPENDECTOMY  1984   BREAST SURGERY     cardiac ablasion  05/05/2023   CHOLECYSTECTOMY  1980   COLONOSCOPY  2011   [facility]   ESOPHAGOGASTRODUODENOSCOPY     right after gallbladder removal    MASTECTOMY Bilateral    for mastitis. Says to do blood pressure on left    PARTIAL HIP ARTHROPLASTY  2017   replacement of head per patient    THYROIDECTOMY  1991    Allergies:  Allergies  Allergen Reactions   Codeine Shortness Of Breath    Family History:  Family History  Problem Relation Age of Onset   COPD Father    Heart disease Father    Hypertension Father    Stomach cancer Father        said that they autospy didn't state but dr thinks he did have this. Died with internal bleeding and cardiac arrest    Diabetes Brother    Hyperlipidemia Brother    Heart disease Daughter    Stroke Maternal Grandmother    Heart attack Maternal Grandfather    Heart attack Paternal Grandmother    Early death Paternal Grandfather    Colon cancer Neg Hx    Esophageal cancer Neg Hx    Rectal cancer Neg Hx     Social History:  Social History   Tobacco Use   Smoking status: Never   Smokeless tobacco: Never  Vaping Use   Vaping status: Never Used  Substance Use Topics   Alcohol use: No   Drug use: Never    Review of symptoms:  Constitutional:  Negative for unexplained weight loss, night sweats, fever,  chills ENT:  Negative for nose bleeds, sinus pain, painful swallowing CV:  Negative for chest pain, shortness of breath, exercise intolerance, palpitations, loss of consciousness Resp:  Negative for cough, wheezing, shortness of breath GI:  Negative for nausea, vomiting, diarrhea, bloody stools GU:  Positives noted in HPI; otherwise negative for gross hematuria, dysuria, urinary incontinence Neuro:  Negative for seizures, poor balance, limb weakness, slurred speech Psych:  Negative for lack of energy, depression, anxiety Endocrine:  Negative for polydipsia, polyuria, symptoms of hypoglycemia (dizziness, hunger, sweating) Hematologic:  Negative for anemia, purpura, petechia, prolonged or excessive bleeding, use of anticoagulants  Allergic:  Negative for difficulty breathing or choking as a result of exposure to anything; no shellfish allergy; no allergic response (rash/itch) to materials, foods  Physical exam: LMP  (LMP Unknown)  GENERAL APPEARANCE:  Well appearing, well developed, well nourished, NAD HEENT: Atraumatic, Normocephalic. NECK: Normal appearance LUNGS: Normal inspiratory and expiratory excursion HEART: Regular Rate ABDOMEN: *** EXTREMITIES: Moves all extremities well.  Without clubbing, cyanosis, or edema. NEUROLOGIC:  Alert and oriented x 3, normal gait, CN II-XII grossly intact.  MENTAL STATUS:  Appropriate. SKIN:  Warm, dry and intact.    Results: No results found for this or any previous visit (from the past 24 hours).  I have reviewed prior patient's records  I have reviewed referring/prior physicians records  I have reviewed urinalysis  I have reviewed prior urine cultures  I reviewed prior imaging studies  Assessment: No diagnosis found.   Plan: ***

## 2024-01-30 ENCOUNTER — Ambulatory Visit: Admitting: Urology

## 2024-02-12 NOTE — Progress Notes (Incomplete)
 Chief Complaint:   History of Present Illness:  Shelia Roth is a 78 y.o. female who is seen in consultation from Tonna Frederic, MD for evaluation of hematuria.   Past Medical History:  Past Medical History:  Diagnosis Date   Asthma    Atrial fibrillation (HCC)    Cataract    Chicken pox    Gallstones    Glaucoma    Hyperlipidemia    pt denies, never taken meds   Hypertension    Osteoporosis    Thyroid  disease    UTI (urinary tract infection)     Past Surgical History:  Past Surgical History:  Procedure Laterality Date   ABDOMINAL HYSTERECTOMY     APPENDECTOMY  1984   BREAST SURGERY     cardiac ablasion  05/05/2023   CHOLECYSTECTOMY  1980   COLONOSCOPY  2011   [facility]   ESOPHAGOGASTRODUODENOSCOPY     right after gallbladder removal    MASTECTOMY Bilateral    for mastitis. Says to do blood pressure on left    PARTIAL HIP ARTHROPLASTY  2017   replacement of head per patient    THYROIDECTOMY  1991    Allergies:  Allergies  Allergen Reactions   Codeine Shortness Of Breath    Family History:  Family History  Problem Relation Age of Onset   COPD Father    Heart disease Father    Hypertension Father    Stomach cancer Father        said that they autospy didn't state but dr thinks he did have this. Died with internal bleeding and cardiac arrest    Diabetes Brother    Hyperlipidemia Brother    Heart disease Daughter    Stroke Maternal Grandmother    Heart attack Maternal Grandfather    Heart attack Paternal Grandmother    Early death Paternal Grandfather    Colon cancer Neg Hx    Esophageal cancer Neg Hx    Rectal cancer Neg Hx     Social History:  Social History   Tobacco Use   Smoking status: Never   Smokeless tobacco: Never  Vaping Use   Vaping status: Never Used  Substance Use Topics   Alcohol use: No   Drug use: Never    Review of symptoms:  Constitutional:  Negative for unexplained weight loss, night sweats, fever,  chills ENT:  Negative for nose bleeds, sinus pain, painful swallowing CV:  Negative for chest pain, shortness of breath, exercise intolerance, palpitations, loss of consciousness Resp:  Negative for cough, wheezing, shortness of breath GI:  Negative for nausea, vomiting, diarrhea, bloody stools GU:  Positives noted in HPI; otherwise negative for gross hematuria, dysuria, urinary incontinence Neuro:  Negative for seizures, poor balance, limb weakness, slurred speech Psych:  Negative for lack of energy, depression, anxiety Endocrine:  Negative for polydipsia, polyuria, symptoms of hypoglycemia (dizziness, hunger, sweating) Hematologic:  Negative for anemia, purpura, petechia, prolonged or excessive bleeding, use of anticoagulants  Allergic:  Negative for difficulty breathing or choking as a result of exposure to anything; no shellfish allergy; no allergic response (rash/itch) to materials, foods  Physical exam: LMP  (LMP Unknown)  GENERAL APPEARANCE:  Well appearing, well developed, well nourished, NAD HEENT: Atraumatic, Normocephalic. NECK: Normal appearance LUNGS: Normal inspiratory and expiratory excursion HEART: Regular Rate ABDOMEN: *** EXTREMITIES: Moves all extremities well.  Without clubbing, cyanosis, or edema. NEUROLOGIC:  Alert and oriented x 3, normal gait, CN II-XII grossly intact.  MENTAL STATUS:  Appropriate. SKIN:  Warm, dry and intact.    Results: No results found for this or any previous visit (from the past 24 hours).  I have reviewed prior patient's records  I have reviewed referring/prior physicians records  I have reviewed urinalysis  I have reviewed prior urine cultures  I reviewed prior imaging studies  Assessment: No diagnosis found.   Plan: ***

## 2024-02-13 ENCOUNTER — Ambulatory Visit: Admitting: Urology

## 2024-02-15 DIAGNOSIS — I447 Left bundle-branch block, unspecified: Secondary | ICD-10-CM | POA: Diagnosis not present

## 2024-02-15 DIAGNOSIS — I480 Paroxysmal atrial fibrillation: Secondary | ICD-10-CM | POA: Diagnosis not present

## 2024-02-15 DIAGNOSIS — I10 Essential (primary) hypertension: Secondary | ICD-10-CM | POA: Diagnosis not present

## 2024-02-18 DIAGNOSIS — I447 Left bundle-branch block, unspecified: Secondary | ICD-10-CM | POA: Diagnosis not present

## 2024-02-18 DIAGNOSIS — I440 Atrioventricular block, first degree: Secondary | ICD-10-CM | POA: Diagnosis not present

## 2024-02-18 DIAGNOSIS — I10 Essential (primary) hypertension: Secondary | ICD-10-CM | POA: Diagnosis not present

## 2024-02-26 NOTE — Progress Notes (Unsigned)
 Chief Complaint:   History of Present Illness:  Shelia Roth is a 78 y.o. female who is seen in consultation from Shelia Elsie Sayre, MD for evaluation of hematuria.  She does have prior urologic history of recurrent urinary tract infections.  She saw Dr. Donelle here in [facility] for some time regarding that.  She has not had any urinary infections recently.  She does states that she has passed 1 stone several years ago.  She has had no gross hematuria and no recent right flank pain.  She did have MRI of her abdomen in October 2024 for follow-up of a pancreatic cyst.  Comment was made on kidneys: Adrenals/Urinary Tract: Adrenal glands are unremarkable. Numerous bilateral parapelvic and renal cortical cysts, benign, requiring no further follow-up or characterization. Kidneys are otherwise normal, without obvious renal calculi, solid lesion, or hydronephrosis.   She does have urinary frequency but denies significant bothersome symptoms.  Past Medical History:  Past Medical History:  Diagnosis Date   Asthma    Atrial fibrillation (HCC)    Cataract    Chicken pox    Gallstones    Glaucoma    Hyperlipidemia    pt denies, never taken meds   Hypertension    Osteoporosis    Thyroid  disease    UTI (urinary tract infection)     Past Surgical History:  Past Surgical History:  Procedure Laterality Date   ABDOMINAL HYSTERECTOMY     APPENDECTOMY  1984   BREAST SURGERY     cardiac ablasion  05/05/2023   CHOLECYSTECTOMY  1980   COLONOSCOPY  2011   [facility]   ESOPHAGOGASTRODUODENOSCOPY     right after gallbladder removal    MASTECTOMY Bilateral    for mastitis. Says to do blood pressure on left    PARTIAL HIP ARTHROPLASTY  2017   replacement of head per patient    THYROIDECTOMY  1991    Allergies:  Allergies  Allergen Reactions   Codeine Shortness Of Breath    Family History:  Family History  Problem Relation Age of Onset   COPD Father    Heart  disease Father    Hypertension Father    Stomach cancer Father        said that they autospy didn't state but dr thinks he did have this. Died with internal bleeding and cardiac arrest    Diabetes Brother    Hyperlipidemia Brother    Heart disease Daughter    Stroke Maternal Grandmother    Heart attack Maternal Grandfather    Heart attack Paternal Grandmother    Early death Paternal Grandfather    Colon cancer Neg Hx    Esophageal cancer Neg Hx    Rectal cancer Neg Hx     Social History:  Social History   Tobacco Use   Smoking status: Never   Smokeless tobacco: Never  Vaping Use   Vaping status: Never Used  Substance Use Topics   Alcohol use: No   Drug use: Never    Review of symptoms:  Constitutional:  Negative for unexplained weight loss, night sweats, fever, chills ENT:  Negative for nose bleeds, sinus pain, painful swallowing CV:  Negative for chest pain, shortness of breath, exercise intolerance, palpitations, loss of consciousness Resp:  Negative for cough, wheezing, shortness of breath GI:  Negative for nausea, vomiting, diarrhea, bloody stools GU:  Positives noted in HPI; otherwise negative for gross hematuria, dysuria, urinary incontinence Neuro:  Negative for seizures, poor balance,  limb weakness, slurred speech Psych:  Negative for lack of energy, depression, anxiety Endocrine:  Negative for polydipsia, polyuria, symptoms of hypoglycemia (dizziness, hunger, sweating) Hematologic:  Negative for anemia, purpura, petechia, prolonged or excessive bleeding, use of anticoagulants  Allergic:  Negative for difficulty breathing or choking as a result of exposure to anything; no shellfish allergy; no allergic response (rash/itch) to materials, foods  Physical exam: LMP  (LMP Unknown)  GENERAL APPEARANCE:  Well appearing, well developed, well nourished, NAD HEENT: Atraumatic, Normocephalic. NECK: Normal appearance LUNGS: Normal inspiratory and expiratory  excursion HEART: Regular Rate ABDOMEN: Mildly rounded, no mass, no tenderness.  No CVA tenderness. EXTREMITIES: Moves all extremities well.  Without clubbing, cyanosis, or edema. NEUROLOGIC:  Alert and oriented x 3, normal gait, CN II-XII grossly intact.  MENTAL STATUS:  Appropriate. SKIN:  Warm, dry and intact.    Results:   I have reviewed referring/prior physicians records  I have reviewed urinalysis  I have reviewed prior urine cultures-urine culture from May of this year negative  I reviewed prior imaging studies--residual urine volume 9 mL  MRI images reviewed  Assessment: Microscopic hematuria.  Bilateral renal cysts.  History of stone, nothing seen on recent MRI which was not the best modality for imaging stones  Vaginal atrophic changes Plan: -I do not think she needs upper tract imaging as she did have the MRI  -I will have her come back for cystoscopy  -Urine cytology sent today  -Estrogen cream sent in for patient

## 2024-02-27 ENCOUNTER — Encounter: Payer: Self-pay | Admitting: Urology

## 2024-02-27 ENCOUNTER — Ambulatory Visit: Admitting: Urology

## 2024-02-27 VITALS — BP 157/72 | HR 76 | Ht 64.0 in | Wt 156.0 lb

## 2024-02-27 DIAGNOSIS — N952 Postmenopausal atrophic vaginitis: Secondary | ICD-10-CM | POA: Diagnosis not present

## 2024-02-27 DIAGNOSIS — Z87442 Personal history of urinary calculi: Secondary | ICD-10-CM | POA: Diagnosis not present

## 2024-02-27 DIAGNOSIS — R3129 Other microscopic hematuria: Secondary | ICD-10-CM

## 2024-02-27 DIAGNOSIS — R319 Hematuria, unspecified: Secondary | ICD-10-CM | POA: Diagnosis not present

## 2024-02-27 DIAGNOSIS — N281 Cyst of kidney, acquired: Secondary | ICD-10-CM | POA: Diagnosis not present

## 2024-02-27 LAB — URINALYSIS, ROUTINE W REFLEX MICROSCOPIC
Bilirubin, UA: NEGATIVE
Glucose, UA: NEGATIVE
Ketones, UA: NEGATIVE
Leukocytes,UA: NEGATIVE
Nitrite, UA: NEGATIVE
Protein,UA: NEGATIVE
Specific Gravity, UA: 1.025 (ref 1.005–1.030)
Urobilinogen, Ur: 0.2 mg/dL (ref 0.2–1.0)
pH, UA: 5.5 (ref 5.0–7.5)

## 2024-02-27 LAB — MICROSCOPIC EXAMINATION: Epithelial Cells (non renal): >10 /HPF — ABNORMAL HIGH (ref 0–10)

## 2024-02-27 LAB — BLADDER SCAN AMB NON-IMAGING: Scan Result: 9

## 2024-02-27 MED ORDER — ESTRADIOL 0.1 MG/GM VA CREA: TOPICAL_CREAM | VAGINAL | 3 refills | Status: AC

## 2024-02-27 NOTE — Addendum Note (Signed)
 Addended by: KOLEEN CREE C on: 02/27/2024 12:07 PM   Modules accepted: Orders

## 2024-03-06 ENCOUNTER — Encounter: Payer: Self-pay | Admitting: Urology

## 2024-03-11 NOTE — Progress Notes (Signed)
 Assessment: Microscopic hematuria.  Bilateral renal cysts.  History of stone, nothing seen on recent MRI which was not the best modality for imaging stones  Vaginal atrophic changes Plan:  History of Present Illness:    She does have prior urologic history of recurrent urinary tract infections.  She saw Dr. Donelle here in [facility] for some time regarding that.  She has not had any urinary infections recently.  She does states that she has passed 1 stone several years ago.  She has had no gross hematuria and no recent right flank pain.  She did have MRI of her abdomen in October 2024 for follow-up of a pancreatic cyst.  Comment was made on kidneys: Adrenals/Urinary Tract: Adrenal glands are unremarkable. Numerous bilateral parapelvic and renal cortical cysts, benign, requiring no further follow-up or characterization. Kidneys are otherwise normal, without obvious renal calculi, solid lesion, or hydronephrosis.   She does have urinary frequency but denies significant bothersome symptoms.  [id number]: Here for cystoscopy Past Medical History:  Past Medical History:  Diagnosis Date   Asthma    Atrial fibrillation (HCC)    Cataract    Chicken pox    Gallstones    Glaucoma    Hyperlipidemia    pt denies, never taken meds   Hypertension    Osteoporosis    Thyroid  disease    UTI (urinary tract infection)     Past Surgical History:  Past Surgical History:  Procedure Laterality Date   ABDOMINAL HYSTERECTOMY     APPENDECTOMY  1984   BREAST SURGERY     cardiac ablasion  05/05/2023   CHOLECYSTECTOMY  1980   COLONOSCOPY  2011   [facility]   ESOPHAGOGASTRODUODENOSCOPY     right after gallbladder removal    MASTECTOMY Bilateral    for mastitis. Says to do blood pressure on left    PARTIAL HIP ARTHROPLASTY  2017   replacement of head per patient    THYROIDECTOMY  1991    Allergies:  Allergies  Allergen Reactions   Codeine Shortness Of Breath    Family  History:  Family History  Problem Relation Age of Onset   COPD Father    Heart disease Father    Hypertension Father    Stomach cancer Father        said that they autospy didn't state but dr thinks he did have this. Died with internal bleeding and cardiac arrest    Diabetes Brother    Hyperlipidemia Brother    Heart disease Daughter    Stroke Maternal Grandmother    Heart attack Maternal Grandfather    Heart attack Paternal Grandmother    Early death Paternal Grandfather    Colon cancer Neg Hx    Esophageal cancer Neg Hx    Rectal cancer Neg Hx     Social History:  Social History   Tobacco Use   Smoking status: Never   Smokeless tobacco: Never  Vaping Use   Vaping status: Never Used  Substance Use Topics   Alcohol use: No   Drug use: Never    Review of symptoms:  Constitutional:  Negative for unexplained weight loss, night sweats, fever, chills ENT:  Negative for nose bleeds, sinus pain, painful swallowing CV:  Negative for chest pain, shortness of breath, exercise intolerance, palpitations, loss of consciousness Resp:  Negative for cough, wheezing, shortness of breath GI:  Negative for nausea, vomiting, diarrhea, bloody stools GU:  Positives noted in HPI; otherwise negative for gross  hematuria, dysuria, urinary incontinence Neuro:  Negative for seizures, poor balance, limb weakness, slurred speech Psych:  Negative for lack of energy, depression, anxiety Endocrine:  Negative for polydipsia, polyuria, symptoms of hypoglycemia (dizziness, hunger, sweating) Hematologic:  Negative for anemia, purpura, petechia, prolonged or excessive bleeding, use of anticoagulants  Allergic:  Negative for difficulty breathing or choking as a result of exposure to anything; no shellfish allergy; no allergic response (rash/itch) to materials, foods  Physical exam: LMP  (LMP Unknown)  GENERAL APPEARANCE:  Well appearing, well developed, well nourished, NAD HEENT: Atraumatic,  Normocephalic. NECK: Normal appearance LUNGS: Normal inspiratory and expiratory excursion HEART: Regular Rate ABDOMEN: Mildly rounded, no mass, no tenderness.  No CVA tenderness. EXTREMITIES: Moves all extremities well.  Without clubbing, cyanosis, or edema. NEUROLOGIC:  Alert and oriented x 3, normal gait, CN II-XII grossly intact.  MENTAL STATUS:  Appropriate. SKIN:  Warm, dry and intact.    Results:   I have reviewed referring/prior physicians records  I have reviewed urinalysis  I have reviewed prior urine cultures-urine culture from May of this year negative  I reviewed prior imaging studies--residual urine volume 9 mL  Indication: Microhematuria  After informed consent and discussion of the procedure and its risks, pt was positioned and prepped in the standard fashion.  Cystoscopy was performed with a flexible cystoscope.   Findings:  Urethra:*** Ureteral orifices:*** Bladder:***

## 2024-03-12 ENCOUNTER — Ambulatory Visit: Admitting: Urology

## 2024-03-12 VITALS — BP 153/73 | HR 66 | Ht 64.0 in | Wt 155.0 lb

## 2024-03-12 DIAGNOSIS — N281 Cyst of kidney, acquired: Secondary | ICD-10-CM | POA: Diagnosis not present

## 2024-03-12 DIAGNOSIS — Z87442 Personal history of urinary calculi: Secondary | ICD-10-CM | POA: Diagnosis not present

## 2024-03-12 DIAGNOSIS — R3129 Other microscopic hematuria: Secondary | ICD-10-CM | POA: Diagnosis not present

## 2024-03-12 DIAGNOSIS — N362 Urethral caruncle: Secondary | ICD-10-CM

## 2024-03-12 DIAGNOSIS — N952 Postmenopausal atrophic vaginitis: Secondary | ICD-10-CM

## 2024-03-12 LAB — MICROSCOPIC EXAMINATION: RBC, Urine: >30 /HPF — AB (ref 0–2)

## 2024-03-12 LAB — URINALYSIS, ROUTINE W REFLEX MICROSCOPIC
Bilirubin, UA: NEGATIVE
Glucose, UA: NEGATIVE
Ketones, UA: NEGATIVE
Leukocytes,UA: NEGATIVE
Nitrite, UA: NEGATIVE
Specific Gravity, UA: 1.03 (ref 1.005–1.030)
Urobilinogen, Ur: 0.2 mg/dL (ref 0.2–1.0)
pH, UA: 5.5 (ref 5.0–7.5)

## 2024-03-12 MED ORDER — CIPROFLOXACIN HCL 500 MG PO TABS
500.0000 mg | ORAL_TABLET | Freq: Once | ORAL | Status: AC
  Administered 2024-03-12: 500 mg via ORAL

## 2024-03-19 ENCOUNTER — Other Ambulatory Visit: Payer: Self-pay | Admitting: Family Medicine

## 2024-03-19 DIAGNOSIS — I519 Heart disease, unspecified: Secondary | ICD-10-CM

## 2024-03-19 DIAGNOSIS — I10 Essential (primary) hypertension: Secondary | ICD-10-CM

## 2024-03-19 DIAGNOSIS — I480 Paroxysmal atrial fibrillation: Secondary | ICD-10-CM

## 2024-03-25 ENCOUNTER — Other Ambulatory Visit: Payer: Self-pay | Admitting: Family Medicine

## 2024-03-25 DIAGNOSIS — I10 Essential (primary) hypertension: Secondary | ICD-10-CM

## 2024-03-25 DIAGNOSIS — I519 Heart disease, unspecified: Secondary | ICD-10-CM

## 2024-03-25 DIAGNOSIS — I480 Paroxysmal atrial fibrillation: Secondary | ICD-10-CM

## 2024-05-12 ENCOUNTER — Other Ambulatory Visit: Payer: Self-pay

## 2024-05-12 ENCOUNTER — Telehealth: Payer: Self-pay

## 2024-05-12 DIAGNOSIS — I480 Paroxysmal atrial fibrillation: Secondary | ICD-10-CM

## 2024-05-12 DIAGNOSIS — I519 Heart disease, unspecified: Secondary | ICD-10-CM

## 2024-05-12 DIAGNOSIS — I10 Essential (primary) hypertension: Secondary | ICD-10-CM

## 2024-05-12 MED ORDER — CARVEDILOL 12.5 MG PO TABS: 12.5000 mg | ORAL_TABLET | Freq: Two times a day (BID) | ORAL | 0 refills | Status: AC

## 2024-05-12 NOTE — Telephone Encounter (Signed)
 Copied from CRM #[id number]. Topic: Clinical - Medication Question >> May 12, 2024  9:15 AM Burnard DEL wrote: Reason for CRM: Patient called in stating that she does not have any carvedilol  (COREG ) 12.5 MG tablet. Request was refused due to refill too soon.Patient stated he request was sent with  her other prescriptions ,so it looked like it was too soon for them.She would like to know if she could have 46 tablets sent to [facility] [id number] - [place], [place] - [address]  Phone: [phone] Fax: [phone] Until she gets her other shipment from [facility] on October 15,2025? A new prescription would need to be sent to [facility] as well.

## 2024-05-27 ENCOUNTER — Encounter: Payer: Self-pay | Admitting: Family Medicine

## 2024-05-27 ENCOUNTER — Ambulatory Visit: Admitting: Family Medicine

## 2024-05-27 VITALS — BP 124/64 | HR 62 | Temp 96.9°F | Ht 64.0 in | Wt 158.0 lb

## 2024-05-27 DIAGNOSIS — E7800 Pure hypercholesterolemia, unspecified: Secondary | ICD-10-CM | POA: Diagnosis not present

## 2024-05-27 DIAGNOSIS — Z23 Encounter for immunization: Secondary | ICD-10-CM | POA: Insufficient documentation

## 2024-05-27 DIAGNOSIS — I519 Heart disease, unspecified: Secondary | ICD-10-CM

## 2024-05-27 DIAGNOSIS — E611 Iron deficiency: Secondary | ICD-10-CM | POA: Diagnosis not present

## 2024-05-27 DIAGNOSIS — I10 Essential (primary) hypertension: Secondary | ICD-10-CM

## 2024-05-27 DIAGNOSIS — I480 Paroxysmal atrial fibrillation: Secondary | ICD-10-CM | POA: Diagnosis not present

## 2024-05-27 LAB — BASIC METABOLIC PANEL WITH GFR
BUN: 24 mg/dL — ABNORMAL HIGH (ref 6–23)
CO2: 31 meq/L (ref 19–32)
Calcium: 9.5 mg/dL (ref 8.4–10.5)
Chloride: 104 meq/L (ref 96–112)
Creatinine, Ser: 0.79 mg/dL (ref 0.40–1.20)
GFR: 72.26 mL/min (ref >60.00)
Glucose, Bld: 102 mg/dL — ABNORMAL HIGH (ref 70–99)
Potassium: 4.1 meq/L (ref 3.5–5.1)
Sodium: 142 meq/L (ref 135–145)

## 2024-05-27 LAB — CBC WITH DIFFERENTIAL/PLATELET
Basophils Absolute: 0 K/uL (ref 0.0–0.1)
Basophils Relative: 0.9 % (ref 0.0–3.0)
Eosinophils Absolute: 0.1 K/uL (ref 0.0–0.7)
Eosinophils Relative: 2.2 % (ref 0.0–5.0)
HCT: 38.6 % (ref 36.0–46.0)
Hemoglobin: 12.8 g/dL (ref 12.0–15.0)
Lymphocytes Relative: 33.4 % (ref 12.0–46.0)
Lymphs Abs: 1.1 K/uL (ref 0.7–4.0)
MCHC: 33.3 g/dL (ref 30.0–36.0)
MCV: 92.3 fl (ref 78.0–100.0)
Monocytes Absolute: 0.3 K/uL (ref 0.1–1.0)
Monocytes Relative: 8 % (ref 3.0–12.0)
Neutro Abs: 1.8 K/uL (ref 1.4–7.7)
Neutrophils Relative %: 55.5 % (ref 43.0–77.0)
Platelets: 208 K/uL (ref 150.0–400.0)
RBC: 4.19 Mil/uL (ref 3.87–5.11)
RDW: 12.9 % (ref 11.5–15.5)
WBC: 3.3 K/uL — ABNORMAL LOW (ref 4.0–10.5)

## 2024-05-27 LAB — LDL CHOLESTEROL, DIRECT: Direct LDL: 68 mg/dL

## 2024-05-27 NOTE — Progress Notes (Signed)
 Established Patient Office Visit   Subjective:  Patient ID: Shelia Roth, female    DOB: Nov 10, 1945  Age: 78 y.o. MRN: [id number]  Chief Complaint  Patient presents with   Medical Management of Chronic Issues    6 month follow up. Pt is fasting. Flu shot today.     HPI Encounter Diagnoses  Name Primary?   Iron deficiency Yes   Essential hypertension    PAF (paroxysmal atrial fibrillation) (HCC)    LV dysfunction    Elevated LDL cholesterol level    Immunization due    For follow-up of above.  Doing well.  Extensive workup for hematuria was negative.  Continues Eliquis  for history of atrial fibs.  No shortness of breath or chest pain.  Due for follow-up colonoscopy.  Continues to raise and support her great grandson Ila who has been doing well.  His mom passed sometime ago.   Review of Systems  Constitutional: Negative.   HENT: Negative.    Eyes:  Negative for blurred vision, discharge and redness.  Respiratory: Negative.    Cardiovascular: Negative.   Gastrointestinal:  Negative for abdominal pain.  Genitourinary: Negative.   Musculoskeletal: Negative.  Negative for myalgias.  Skin:  Negative for rash.  Neurological:  Negative for tingling, loss of consciousness and weakness.  Endo/Heme/Allergies:  Negative for polydipsia.     Current Outpatient Medications:    atorvastatin  (LIPITOR) 10 MG tablet, Take 1 tablet (10 mg total) by mouth daily before breakfast., Disp: 90 tablet, Rfl: 3   Calcium  Carb-Cholecalciferol (CALCIUM  + VITAMIN D3 PO), Take 1 tablet by mouth 2 (two) times daily. Calcium  600mg  and Vitamin d3 20mcg, Disp: , Rfl:    carvedilol  (COREG ) 12.5 MG tablet, Take 1 tablet (12.5 mg total) by mouth 2 (two) times daily with a meal., Disp: 60 tablet, Rfl: 0   diclofenac Sodium (VOLTAREN) 1 % GEL, Apply 2 g topically 4 (four) times daily as needed., Disp: , Rfl:    ELIQUIS  5 MG TABS tablet, TAKE 1 TABLET BY MOUTH AT BREAKFAST AND AT BEDTIME, Disp: 60 tablet,  Rfl: 10   estradiol  (ESTRACE ) 0.1 MG/GM vaginal cream, Apply a pea-sized amount to vaginal opening using index finger 2-3 nights/week, Disp: 42.5 g, Rfl: 3   ferrous sulfate 324 MG TBEC, Take 324 mg by mouth., Disp: , Rfl:    flecainide (TAMBOCOR) 50 MG tablet, Take 50 mg by mouth 2 (two) times daily., Disp: , Rfl:    losartan  (COZAAR ) 50 MG tablet, TAKE 1 TABLET BY MOUTH ONCE DAILY, Disp: 30 tablet, Rfl: 10   mometasone  (NASONEX ) 50 MCG/ACT nasal spray, USE TWO SPRAYS IN EACH NOSTRIL DAILY, Disp: 17 g, Rfl: 12   Multiple Vitamin (MULTIVITAMIN WITH MINERALS) TABS tablet, Take 1 tablet by mouth daily., Disp: , Rfl:    Objective:     BP 124/64 (BP Location: Right Arm, Patient Position: Sitting, Cuff Size: Normal)   Pulse 62   Temp (!) 96.9 F (36.1 C) (Temporal)   Ht 5' 4 (1.626 m)   Wt 158 lb (71.7 kg)   LMP  (LMP Unknown)   SpO2 95%   BMI 27.12 kg/m    Physical Exam Constitutional:      General: She is not in acute distress.    Appearance: Normal appearance. She is not ill-appearing, toxic-appearing or diaphoretic.  HENT:     Head: Normocephalic and atraumatic.     Right Ear: External ear normal.     Left Ear: External ear normal.  Eyes:     General: No scleral icterus.       Right eye: No discharge.        Left eye: No discharge.     Extraocular Movements: Extraocular movements intact.     Conjunctiva/sclera: Conjunctivae normal.  Cardiovascular:     Rate and Rhythm: Normal rate and regular rhythm.  Pulmonary:     Effort: Pulmonary effort is normal. No respiratory distress.     Breath sounds: No wheezing or rales.  Skin:    General: Skin is warm and dry.  Neurological:     Mental Status: She is alert and oriented to person, place, and time.  Psychiatric:        Mood and Affect: Mood normal.        Behavior: Behavior normal.      No results found for any visits on 05/27/24.    The 10-year ASCVD risk score (Arnett DK, et al., 2019) is: 24%    Assessment &  Plan:   Iron deficiency -     CBC with Differential/Platelet -     Iron, TIBC and Ferritin Panel  Essential hypertension -     Basic metabolic panel with GFR  PAF (paroxysmal atrial fibrillation) (HCC)  LV dysfunction  Elevated LDL cholesterol level -     LDL cholesterol, direct  Immunization due -     Flu vaccine HIGH DOSE PF(Fluzone Trivalent)    Return in about 6 months (around 11/25/2024), or if symptoms worsen or fail to improve.  A-fib is well-controlled.  May possibly be able to discontinue Eliquis .  May need ventricular pacing secondary to LBBB.  Due for follow-up colonoscopy.  Elsie Sim Lent, MD

## 2024-05-28 LAB — IRON,TIBC AND FERRITIN PANEL
%SAT: 33 % (ref 16–45)
Ferritin: 92 ng/mL (ref 16–288)
Iron: 93 ug/dL (ref 45–160)
TIBC: 283 ug/dL (ref 250–450)

## 2024-05-29 ENCOUNTER — Ambulatory Visit: Payer: Self-pay | Admitting: Family Medicine

## 2024-06-17 ENCOUNTER — Other Ambulatory Visit: Payer: Self-pay | Admitting: Family Medicine

## 2024-06-17 DIAGNOSIS — I480 Paroxysmal atrial fibrillation: Secondary | ICD-10-CM

## 2024-07-09 ENCOUNTER — Ambulatory Visit: Admitting: Urology

## 2024-08-25 ENCOUNTER — Ambulatory Visit

## 2024-10-07 ENCOUNTER — Ambulatory Visit

## 2024-11-25 ENCOUNTER — Ambulatory Visit: Admitting: Family Medicine

## 2025-03-11 ENCOUNTER — Ambulatory Visit: Admitting: Urology
